# Patient Record
Sex: Female | Born: 1955 | Race: White | Hispanic: No | Marital: Married | Smoking: Never smoker
Health system: Southern US, Community
[De-identification: ages and names within clinical notes are randomized; demographics above are authoritative.]

## PROBLEM LIST (undated history)

## (undated) DIAGNOSIS — I471 Supraventricular tachycardia, unspecified: Secondary | ICD-10-CM

## (undated) DIAGNOSIS — T7840XA Allergy, unspecified, initial encounter: Secondary | ICD-10-CM

## (undated) HISTORY — DX: Supraventricular tachycardia: I47.1

## (undated) HISTORY — DX: Allergy, unspecified, initial encounter: T78.40XA

---

## 2013-02-24 ENCOUNTER — Ambulatory Visit (INDEPENDENT_AMBULATORY_CARE_PROVIDER_SITE_OTHER): Payer: BC Managed Care – PPO | Admitting: Family Medicine

## 2013-02-24 VITALS — BP 120/72 | HR 63 | Temp 97.8°F | Resp 16 | Ht 64.0 in | Wt 140.2 lb

## 2013-02-24 DIAGNOSIS — I471 Supraventricular tachycardia, unspecified: Secondary | ICD-10-CM | POA: Insufficient documentation

## 2013-02-24 MED ORDER — METOPROLOL SUCCINATE ER 25 MG PO TB24: 25.0000 mg | ORAL_TABLET | Freq: Every day | ORAL | Status: DC

## 2013-02-24 NOTE — Progress Notes (Signed)
Subjective:    Patient ID: Veronica Ray, female    DOB: [birth year], 57 y.o.   MRN: [id number]   Chief Complaint  Patient presents with  . Medication Refill    metoprolol   HPI  Moved here as husband is now employed with [employer] - moved here from [place], [place] where they had been for 5 years.  Here to establish care today. Has had 2 episodes of SVT -initial one episode prior had subsided on its own. During the second episode she did end up in the ER and so was officially diagnosed and has the rhythm strips from that.  During the episode she was under a fair amount of stress at work when she became pre-syncopal.  Was using phone app to check on HR and noted as very very high so went into the ER and was diagnosed w/ SVT.  Did have stress test after and a cardiac MRI and was told her heart was good. Just put on low dose toprol prophylactic ally.  Gets her physicals annually w/ her prior PCP so feels that labs and health maintenance are UTD.  Past Medical History  Diagnosis Date  . Hypertension   . Allergy    No current outpatient prescriptions on file prior to visit.   No current facility-administered medications on file prior to visit.   No Known Allergies   Review of Systems  Constitutional: Negative for fever, chills, diaphoresis and appetite change.  Eyes: Negative for visual disturbance.  Respiratory: Negative for cough and shortness of breath.   Cardiovascular: Negative for chest pain, palpitations and leg swelling.  Genitourinary: Negative for decreased urine volume.  Neurological: Negative for dizziness, tremors, syncope, weakness, light-headedness and headaches.  Hematological: Does not bruise/bleed easily.       BP 120/72  Pulse 63  Temp(Src) 97.8 F (36.6 C) (Oral)  Resp 16  Ht 5\' 4"  (1.626 m)  Wt 140 lb 3.2 oz (63.594 kg)  BMI 24.05 kg/m2  SpO2 99% Objective:   Physical Exam  Constitutional: She is oriented to person, place, and time. She appears well-developed and  well-nourished. No distress.  HENT:  Head: Normocephalic and atraumatic.  Right Ear: External ear normal.  Left Ear: External ear normal.  Eyes: Conjunctivae are normal. No scleral icterus.  Neck: Normal range of motion. Neck supple. No thyromegaly present.  Cardiovascular: Normal rate, regular rhythm, normal heart sounds and intact distal pulses.   Pulmonary/Chest: Effort normal and breath sounds normal. No respiratory distress.  Musculoskeletal: She exhibits no edema.  Lymphadenopathy:    She has no cervical adenopathy.  Neurological: She is alert and oriented to person, place, and time.  Skin: Skin is warm and dry. She is not diaphoretic. No erythema.  Psychiatric: She has a normal mood and affect. Her behavior is normal.      Assessment & Plan:   Paroxysmal SVT (supraventricular tachycardia) - refilled toprol. Pt wants to establish care here. Recommend she schedule an appt at 104 for a CPE at her convenience. Releases were signed and faxed to obtain her old medical records. Meds ordered this encounter  Medications  . DISCONTD: metoprolol succinate (TOPROL-XL) 25 MG 24 hr tablet    Sig: Take 25 mg by mouth daily.  . fish oil-omega-3 fatty acids 1000 MG capsule    Sig: Take 2 g by mouth daily.  . Multiple Vitamins-Minerals (MULTIVITAMIN WITH MINERALS) tablet    Sig: Take 1 tablet by mouth daily.  . metoprolol succinate (TOPROL-XL) 25 MG  24 hr tablet    Sig: Take 1 tablet (25 mg total) by mouth daily.    Dispense:  90 tablet    Refill:  3    I personally performed the services described in this documentation, which was scribed in my presence. The recorded information has been reviewed and considered, and addended by me as needed.  Norberto Sorenson, MD MPH

## 2013-03-07 ENCOUNTER — Telehealth: Payer: Self-pay

## 2013-03-07 NOTE — Telephone Encounter (Addendum)
PT WOULD LIKE TO SPEAK WITH SOMEONE REGARDING HER MEDICATION THAT SHOULD HAVE BEEN SENT TO [employer] SO IT COULD BE MAILED TO HER HOME PLEASE CALL [phone]

## 2013-03-07 NOTE — Telephone Encounter (Signed)
What does she need? Called her left message for her to call me back.

## 2013-03-07 NOTE — Telephone Encounter (Signed)
She does not know the mail order company, she does not have the Rx, she will call tomorrow and let you know which mail order to send the metoprolol

## 2013-03-08 MED ORDER — METOPROLOL SUCCINATE ER 25 MG PO TB24: 25.0000 mg | ORAL_TABLET | Freq: Every day | ORAL | Status: DC

## 2013-03-08 NOTE — Telephone Encounter (Signed)
Pt called back and provided phone and fax for mail order, which is [facility]. It looks as if no pharm was in EP[facility] when Dr Clelia Croft sent Rx at [facility] and therefore it did not go to [facility] at that time. I am sending now.

## 2014-03-13 ENCOUNTER — Encounter: Payer: Self-pay | Admitting: Family Medicine

## 2014-03-13 ENCOUNTER — Ambulatory Visit (INDEPENDENT_AMBULATORY_CARE_PROVIDER_SITE_OTHER): Payer: BC Managed Care – PPO | Admitting: Family Medicine

## 2014-03-13 VITALS — BP 120/80 | HR 64 | Temp 97.9°F | Resp 16 | Ht 63.5 in | Wt 142.0 lb

## 2014-03-13 DIAGNOSIS — I471 Supraventricular tachycardia: Secondary | ICD-10-CM

## 2014-03-13 DIAGNOSIS — B353 Tinea pedis: Secondary | ICD-10-CM

## 2014-03-13 DIAGNOSIS — R635 Abnormal weight gain: Secondary | ICD-10-CM

## 2014-03-13 DIAGNOSIS — Z0000 Encounter for general adult medical examination without abnormal findings: Secondary | ICD-10-CM

## 2014-03-13 DIAGNOSIS — Z1239 Encounter for other screening for malignant neoplasm of breast: Secondary | ICD-10-CM

## 2014-03-13 DIAGNOSIS — J302 Other seasonal allergic rhinitis: Secondary | ICD-10-CM

## 2014-03-13 DIAGNOSIS — Z23 Encounter for immunization: Secondary | ICD-10-CM

## 2014-03-13 DIAGNOSIS — B351 Tinea unguium: Secondary | ICD-10-CM

## 2014-03-13 LAB — COMPREHENSIVE METABOLIC PANEL
ALK PHOS: 97 U/L (ref 39–117)
ALT: 17 U/L (ref 0–35)
AST: 22 U/L (ref 0–37)
Albumin: 4.5 g/dL (ref 3.5–5.2)
BUN: 17 mg/dL (ref 6–23)
CO2: 28 mEq/L (ref 19–32)
Calcium: 9.6 mg/dL (ref 8.4–10.5)
Chloride: 103 mEq/L (ref 96–112)
Creat: 0.54 mg/dL (ref 0.50–1.10)
Glucose, Bld: 93 mg/dL (ref 70–99)
Potassium: 4.3 mEq/L (ref 3.5–5.3)
SODIUM: 140 meq/L (ref 135–145)
TOTAL PROTEIN: 6.7 g/dL (ref 6.0–8.3)
Total Bilirubin: 0.8 mg/dL (ref 0.2–1.2)

## 2014-03-13 LAB — POCT URINALYSIS DIPSTICK
BILIRUBIN UA: NEGATIVE
GLUCOSE UA: NEGATIVE
KETONES UA: NEGATIVE
Leukocytes, UA: NEGATIVE
Nitrite, UA: NEGATIVE
PROTEIN UA: NEGATIVE
Spec Grav, UA: <=1.005
Urobilinogen, UA: 0.2
pH, UA: 7

## 2014-03-13 LAB — CBC
HEMATOCRIT: 41 % (ref 36.0–46.0)
HEMOGLOBIN: 14.3 g/dL (ref 12.0–15.0)
MCH: 30.6 pg (ref 26.0–34.0)
MCHC: 34.9 g/dL (ref 30.0–36.0)
MCV: 87.6 fL (ref 78.0–100.0)
MPV: 10.7 fL (ref 9.4–12.4)
Platelets: 203 10*3/uL (ref 150–400)
RBC: 4.68 MIL/uL (ref 3.87–5.11)
RDW: 13.3 % (ref 11.5–15.5)
WBC: 5 10*3/uL (ref 4.0–10.5)

## 2014-03-13 LAB — TSH: TSH: 1 u[IU]/mL (ref 0.350–4.500)

## 2014-03-13 LAB — LIPID PANEL
CHOL/HDL RATIO: 3.2 ratio
Cholesterol: 185 mg/dL (ref 0–200)
HDL: 57 mg/dL (ref >39)
LDL Cholesterol: 112 mg/dL — ABNORMAL HIGH (ref 0–99)
Triglycerides: 82 mg/dL (ref <150)
VLDL: 16 mg/dL (ref 0–40)

## 2014-03-13 MED ORDER — METOPROLOL SUCCINATE ER 25 MG PO TB24: 25.0000 mg | ORAL_TABLET | Freq: Every day | ORAL | Status: DC

## 2014-03-13 MED ORDER — FLUTICASONE PROPIONATE 50 MCG/ACT NA SUSP: 2.0000 | Freq: Every day | NASAL | Status: DC

## 2014-03-13 NOTE — Patient Instructions (Addendum)
Look at the [facility] website for other treatment alternatives to toenail fungus/athlete's foot.  Allergic Rhinitis Allergic rhinitis is when the mucous membranes in the nose respond to allergens. Allergens are particles in the air that cause your body to have an allergic reaction. This causes you to release allergic antibodies. Through a chain of events, these eventually cause you to release histamine into the blood stream. Although meant to protect the body, it is this release of histamine that causes your discomfort, such as frequent sneezing, congestion, and an itchy, runny nose.  CAUSES  Seasonal allergic rhinitis (hay fever) is caused by pollen allergens that may come from grasses, trees, and weeds. Year-round allergic rhinitis (perennial allergic rhinitis) is caused by allergens such as house dust mites, pet dander, and mold spores.  SYMPTOMS   Nasal stuffiness (congestion).  Itchy, runny nose with sneezing and tearing of the eyes. DIAGNOSIS  Your health care provider can help you determine the allergen or allergens that trigger your symptoms. If you and your health care provider are unable to determine the allergen, skin or blood testing may be used. TREATMENT  Allergic rhinitis does not have a cure, but it can be controlled by:  Medicines and allergy shots (immunotherapy).  Avoiding the allergen. Hay fever may often be treated with antihistamines in pill or nasal spray forms. Antihistamines block the effects of histamine. There are over-the-counter medicines that may help with nasal congestion and swelling around the eyes. Check with your health care provider before taking or giving this medicine.  If avoiding the allergen or the medicine prescribed do not work, there are many new medicines your health care provider can prescribe. Stronger medicine may be used if initial measures are ineffective. Desensitizing injections can be used if medicine and avoidance does not work.  Desensitization is when a patient is given ongoing shots until the body becomes less sensitive to the allergen. Make sure you follow up with your health care provider if problems continue. HOME CARE INSTRUCTIONS It is not possible to completely avoid allergens, but you can reduce your symptoms by taking steps to limit your exposure to them. It helps to know exactly what you are allergic to so that you can avoid your specific triggers. SEEK MEDICAL CARE IF:   You have a fever.  You develop a cough that does not stop easily (persistent).  You have shortness of breath.  You start wheezing.  Symptoms interfere with normal daily activities. Document Released: 12/31/2000 Document Revised: 04/12/2013 Document Reviewed: 12/13/2012 [facility] Patient Information 2015 [place]. This information is not intended to replace advice given to you by your health care provider. Make sure you discuss any questions you have with your health care provider.      Why follow it? Research shows. . Those who follow the Mediterranean diet have a reduced risk of heart disease  . The diet is associated with a reduced incidence of Parkinson's and Alzheimer's diseases . People following the diet may have longer life expectancies and lower rates of chronic diseases  . The Dietary Guidelines for Americans recommends the Mediterranean diet as an eating plan to promote health and prevent disease  What Is the Mediterranean Diet?  . Healthy eating plan based on typical foods and recipes of Mediterranean-style cooking . The diet is primarily a plant based diet; these foods should make up a majority of meals   Starches - Plant based foods should make up a majority of meals - They are an important sources  of vitamins, minerals, energy, antioxidants, and fiber - Choose whole grains, foods high in fiber and minimally processed items  - Typical grain sources include wheat, oats, barley, corn, brown rice, bulgar, farro,  millet, polenta, couscous  - Various types of beans include chickpeas, lentils, fava beans, black beans, white beans   Fruits  Veggies - Large quantities of antioxidant rich fruits & veggies; 6 or more servings  - Vegetables can be eaten raw or lightly drizzled with oil and cooked  - Vegetables common to the traditional Mediterranean Diet include: artichokes, arugula, beets, broccoli, brussel sprouts, cabbage, carrots, celery, collard greens, cucumbers, eggplant, kale, leeks, lemons, lettuce, mushrooms, okra, onions, peas, peppers, potatoes, pumpkin, radishes, rutabaga, shallots, spinach, sweet potatoes, turnips, zucchini - Fruits common to the Mediterranean Diet include: apples, apricots, avocados, cherries, clementines, dates, figs, grapefruits, grapes, melons, nectarines, oranges, peaches, pears, pomegranates, strawberries, tangerines  Fats - Replace butter and margarine with healthy oils, such as olive oil, canola oil, and tahini  - Limit nuts to no more than a handful a day  - Nuts include walnuts, almonds, pecans, pistachios, pine nuts  - Limit or avoid candied, honey roasted or heavily salted nuts - Olives are central to the [employer]iterranean diet - can be eaten whole or used in a variety of dishes   Meats Protein - Limiting red meat: no more than a few times a month - When eating red meat: choose lean cuts and keep the portion to the size of deck of cards - Eggs: approx. 0 to 4 times a week  - Fish and lean poultry: at least 2 a week  - Healthy protein sources include, chicken, [place]key, lean beef, lamb - Increase intake of seafood such as tuna, salmon, trout, mackerel, shrimp, scallops - Avoid or limit high fat processed meats such as sausage and bacon  Dairy - Include moderate amounts of low fat dairy products  - Focus on healthy dairy such as fat free yogurt, skim milk, low or reduced fat cheese - Limit dairy products higher in fat such as whole or 2% milk, cheese, ice cream  Alcohol -  Moderate amounts of red wine is ok  - No more than 5 oz daily for women (all ages) and men older than age 59  - No more than 10 oz of wine daily for men younger than 2365  Other - Limit sweets and other desserts  - Use herbs and spices instead of salt to flavor foods  - Herbs and spices common to the traditional Mediterranean Diet include: basil, bay leaves, chives, cloves, cumin, fennel, garlic, lavender, marjoram, mint, oregano, parsley, pepper, rosemary, sage, savory, sumac, tarragon, thyme   It's not just a diet, it's a lifestyle:  . The Mediterranean diet includes lifestyle factors typical of those in the region  . Foods, drinks and meals are best eaten with others and savored . Daily physical activity is important for overall good health . This could be strenuous exercise like running and aerobics . This could also be more leisurely activities such as walking, housework, yard-work, or taking the stairs . Moderation is the key; a balanced and healthy diet accommodates most foods and drinks . Consider portion sizes and frequency of consumption of certain foods   Meal Ideas & Options:  . Breakfast:  o Whole wheat toast or whole wheat English muffins with peanut butter & hard boiled egg o Steel cut oats topped with apples & cinnamon and skim milk  o Fresh fruit: banana, strawberries,  melon, berries, peaches  o Smoothies: strawberries, bananas, greek yogurt, peanut butter o Low fat greek yogurt with blueberries and granola  o Egg white omelet with spinach and mushrooms o Breakfast couscous: whole wheat couscous, apricots, skim milk, cranberries  . Sandwiches:  o Hummus and grilled vegetables (peppers, zucchini, squash) on whole wheat bread   o Grilled chicken on whole wheat pita with lettuce, tomatoes, cucumbers or tzatziki  o Tuna salad on whole wheat bread: tuna salad made with greek yogurt, olives, red peppers, capers, green onions o Garlic rosemary lamb pita: lamb sauted with garlic,  rosemary, salt & pepper; add lettuce, cucumber, greek yogurt to pita - flavor with lemon juice and black pepper  . Seafood:  o Mediterranean grilled salmon, seasoned with garlic, basil, parsley, lemon juice and black pepper o Shrimp, lemon, and spinach whole-grain pasta salad made with low fat greek yogurt  o Seared scallops with lemon orzo  o Seared tuna steaks seasoned salt, pepper, coriander topped with tomato mixture of olives, tomatoes, olive oil, minced garlic, parsley, green onions and cappers  . Meats:  o Herbed greek chicken salad with kalamata olives, cucumber, feta  o Red bell peppers stuffed with spinach, bulgur, lean ground beef (or lentils) & topped with feta   o Kebabs: skewers of chicken, tomatoes, onions, zucchini, squash  o [place] burgers: made with red onions, mint, dill, lemon juice, feta cheese topped with roasted red peppers . Vegetarian o Cucumber salad: cucumbers, artichoke hearts, celery, red onion, feta cheese, tossed in olive oil & lemon juice  o Hummus and whole grain pita points with a greek salad (lettuce, tomato, feta, olives, cucumbers, red onion) o Lentil soup with celery, carrots made with vegetable broth, garlic, salt and pepper  o Tabouli salad: parsley, bulgur, mint, scallions, cucumbers, tomato, radishes, lemon juice, olive oil, salt and pepper.

## 2014-03-13 NOTE — Progress Notes (Signed)
Subjective:    Patient ID: Fayrene FearingJody Rossini, female    DOB: [birth year], 58 y.o.   MRN: [id number]  HPI This is a 58 yo female who presents today for a CPE.  Last mammo- 6/14 Last pap- 6/14- normal Colonoscopy- 2013 Tdap- 2 years ago Flu- today Dentist- regularly Eye- biannual  She was diagnosed with PSVT last year and has been on metoprolol xl 25 mg. She is tolerating without side effects. No further palpitations.   She regularly exercises (bicycle, treadmill) and tries to eat mostly vegetables, chicken and fish. She has noticed creeping weight gain in her 2250s.  Has had a sensation of fluid in her ears and near dizziness with rapid head movements for the last week. This was preceded by some nasal congestion. She has not taken any OTC medications for this.   Has some fungus on her right 4th and 5th toe nails. She is only interested in homeopathic remedies for this as she is concerned about side effects of Lamisil. She has also had some athlete's foot which has gotten better with epsom salts and tea tree oil.   Past Medical History  Diagnosis Date  . Hypertension   . Allergy    Past Surgical History  Procedure Laterality Date  . Cesarean section     Family History  Problem Relation Age of Onset  . Cancer Paternal Grandmother   . Cancer Paternal Grandfather    History  Substance Use Topics  . Smoking status: Never Smoker   . Smokeless tobacco: Not on file  . Alcohol Use: No   Review of Systems  Constitutional: Negative.   HENT: Positive for congestion. Ear pain: ear fullness.   Eyes: Negative.   Respiratory: Negative.   Cardiovascular: Negative.   Gastrointestinal: Negative.   Endocrine: Negative.   Genitourinary: Negative.   Musculoskeletal: Negative.   Skin: Rash: athlete's foot bilaterally, toe nail fungus right   Allergic/Immunologic: Positive for environmental allergies.  Neurological: Negative.   Hematological: Negative.   Psychiatric/Behavioral: Negative.         Objective:   Physical Exam  Constitutional: She is oriented to person, place, and time. She appears well-developed and well-nourished.  HENT:  Head: Normocephalic and atraumatic.  Right Ear: External ear and ear canal normal.  Left Ear: External ear and ear canal normal.  Nose: Nose normal.  Mouth/Throat: Oropharynx is clear and moist. No oropharyngeal exudate.  Bilateral TMs opaque.  Eyes: Conjunctivae and EOM are normal. Pupils are equal, round, and reactive to light. Right eye exhibits no discharge. Left eye exhibits no discharge. No scleral icterus.  Neck: Normal range of motion. Neck supple. No thyromegaly present.  Cardiovascular: Normal rate, regular rhythm, normal heart sounds and intact distal pulses.   Pulmonary/Chest: Effort normal and breath sounds normal. Right breast exhibits no inverted nipple, no mass, no nipple discharge, no skin change and no tenderness. Left breast exhibits no inverted nipple, no mass, no nipple discharge, no skin change and no tenderness. Breasts are symmetrical.  Abdominal: Soft. Bowel sounds are normal.  Genitourinary: Rectum normal and vagina normal. No breast swelling, tenderness, discharge or bleeding. Pelvic exam was performed with patient supine. No labial fusion. There is no rash, tenderness, lesion or injury on the right labia. There is no rash, tenderness, lesion or injury on the left labia. Cervix exhibits no discharge and no friability. No vaginal discharge found.  Musculoskeletal: Normal range of motion.  Lymphadenopathy:    She has no cervical adenopathy.  Neurological: She is  alert and oriented to person, place, and time. She has normal reflexes.  Skin: Skin is warm and dry.  Right 4th and 5th toenails with some yellowing and thickening. Nails trimmed close. Small amount peeling bottom of left foot.    Psychiatric: She has a normal mood and affect. Her behavior is normal. Judgment and thought content normal.  Vitals reviewed.  BP  120/80 mmHg  Pulse 64  Temp(Src) 97.9 F (36.6 C) (Oral)  Resp 16  Ht 5' 3.5" (1.613 m)  Wt 142 lb (64.411 kg)  BMI 24.76 kg/m2  SpO2 96%     Assessment & Plan:  1. Annual physical exam - POCT urinalysis dipstick  2. SVT (supraventricular tachycardia) - metoprolol succinate (TOPROL-XL) 25 MG 24 hr tablet; Take 1 tablet (25 mg total) by mouth daily.  Dispense: 90 tablet; Refill: 3 - CBC - Lipid panel - Comprehensive metabolic panel - TSH  3. Other seasonal allergic rhinitis - fluticasone (FLONASE) 50 MCG/ACT nasal spray; Place 2 sprays into both nostrils daily.  Dispense: 16 g; Refill: 5  4. Weight gain - Comprehensive metabolic panel - TSH  5. Screening for breast cancer - MM DIGITAL SCREENING BILATERAL; Future  6. Flu vaccine need - Flu Vaccine QUAD 36+ mos IM  7. Toenail fungus -patient prefers to explore non pharmacologic remedies.   8. Tinea pedis of left foot -She seems to be getting some improvement with Epsom salts and tea tree oil product. Encouraged synthetic socks with cycling.    Emi Belfasteborah B. Ayaka Andes, FNP-BC  [facility]  03/13/2014 10:07 PM

## 2014-04-07 ENCOUNTER — Ambulatory Visit
Admission: RE | Admit: 2014-04-07 | Discharge: 2014-04-07 | Disposition: A | Discharge status: Home / Self Care | Payer: BC Managed Care – PPO | Source: Ambulatory Visit | Attending: Family Medicine | Admitting: Family Medicine

## 2014-04-07 DIAGNOSIS — Z1239 Encounter for other screening for malignant neoplasm of breast: Secondary | ICD-10-CM

## 2014-05-01 ENCOUNTER — Telehealth: Payer: Self-pay

## 2014-05-01 ENCOUNTER — Ambulatory Visit (INDEPENDENT_AMBULATORY_CARE_PROVIDER_SITE_OTHER): Payer: BC Managed Care – PPO | Admitting: Physician Assistant

## 2014-05-01 VITALS — BP 122/80 | HR 69 | Temp 98.2°F | Resp 16 | Ht 64.0 in | Wt 143.6 lb

## 2014-05-01 DIAGNOSIS — H6983 Other specified disorders of Eustachian tube, bilateral: Secondary | ICD-10-CM

## 2014-05-01 DIAGNOSIS — H8110 Benign paroxysmal vertigo, unspecified ear: Secondary | ICD-10-CM

## 2014-05-01 MED ORDER — FEXOFENADINE HCL 30 MG PO TABS: 30.0000 mg | ORAL_TABLET | Freq: Two times a day (BID) | ORAL | Status: DC

## 2014-05-01 MED ORDER — FEXOFENADINE HCL 60 MG PO TABS: 60.0000 mg | ORAL_TABLET | Freq: Two times a day (BID) | ORAL | Status: AC

## 2014-05-01 MED ORDER — LORATADINE 10 MG PO TABS: 10.0000 mg | ORAL_TABLET | Freq: Every day | ORAL | Status: DC

## 2014-05-01 MED ORDER — MECLIZINE HCL 32 MG PO TABS: 32.0000 mg | ORAL_TABLET | Freq: Three times a day (TID) | ORAL | Status: DC | PRN

## 2014-05-01 NOTE — Progress Notes (Signed)
IDENTIFYING INFORMATION  Veronica Ray / DOB: 20-Jul-1955 / MRN: [id number]  The patient has Paroxysmal SVT (supraventricular tachycardia) on her problem list.  SUBJECTIVE  CC: Ear Fullness and Hand Pain   HPI: Veronica Ray is a 59 y.o. y.o. female presenting for ear fullness and mild dizziness with turning her head quickly. This started three to four weeks ago. She reports tugging at her ears and feels like if she could "pop her ears" she would feel better.  She also feels dizzy when she turns her head quickly.  She describes this as a spinning sensation, and reports it last roughly 10-15 seconds and then resolves.   She has been using Flonase for the last three weeks, and she also uses a [employer] Pot, and she does not know if this is helping.    She complains of right mild thumb pain that started a week ago after opening a jar of pickles.  Pincer grasping makes her pain worse.  She denies any trauma to the area.  She does not have a history of osteoporosis/penia.  She denies a history of arthritis.  She has not tried anything for this problem.     She  has a past medical history of Allergy.    She has a current medication list which includes the following prescription(s): b complex vitamins, fish oil-omega-3 fatty acids, fluticasone, metoprolol succinate, and multivitamin with minerals.  Veronica Ray has No Known Allergies. She  reports that she has never smoked. She does not have any smokeless tobacco history on file. She reports that she does not drink alcohol or use illicit drugs. She  has no sexual activity history on file.  The patient  has past surgical history that includes Cesarean section.  Her family history includes Cancer in her paternal grandfather and paternal grandmother.  Review of Systems  Constitutional: Negative.   HENT: Positive for congestion. Negative for ear discharge, ear pain, hearing loss, sore throat and tinnitus.   Eyes: Negative.   Respiratory: Negative for  cough, sputum production and shortness of breath.   Cardiovascular: Negative for chest pain.  Gastrointestinal: Negative.   Musculoskeletal: Negative for neck pain.  Neurological: Positive for dizziness (transient, with head turning only). Negative for tingling, tremors and headaches.    OBJECTIVE  Blood pressure 122/80, pulse 69, temperature 98.2 F (36.8 C), temperature source Oral, resp. rate 16, height 5\' 4"  (1.626 m), weight 143 lb 9.6 oz (65.137 kg), SpO2 98 %. The patient's body mass index is 24.64 kg/(m^2).  Physical Exam  Constitutional: She is oriented to person, place, and time. She appears well-developed and well-nourished. No distress.  HENT:  Right Ear: No drainage or tenderness. Tympanic membrane is not injected, not perforated, not erythematous and not bulging. No middle ear effusion. No decreased hearing is noted.  Left Ear: No drainage or tenderness. Tympanic membrane is not injected, not perforated, not erythematous and not bulging.  No middle ear effusion. No decreased hearing is noted.  Ears:  Nose: Nose normal. No mucosal edema.  Mouth/Throat: Uvula is midline, oropharynx is clear and moist and mucous membranes are normal.  Cardiovascular: Normal rate, regular rhythm, normal heart sounds and intact distal pulses.   Respiratory: Effort normal and breath sounds normal.  Neurological: She is alert and oriented to person, place, and time. She has normal strength. She displays no tremor. No cranial nerve deficit or sensory deficit. She exhibits normal muscle tone. Coordination and gait normal.  Skin: Skin is warm and dry.  She is not diaphoretic.  Psychiatric: She has a normal mood and affect. Her behavior is normal. Judgment and thought content normal.    No results found for this or any previous visit (from the past 24 hour(s)).  ASSESSMENT & PLAN  Alayza was seen today for ear fullness and hand pain.  Diagnoses and associated orders for this visit:  Eustachian  tube dysfunction, bilateral: Patient is a poor candidate for pseudoephedrine.   - loratadine (CLARITIN) 10 MG tablet; Take 1 tablet (10 mg total) by mouth daily. - fexofenadine (ALLEGRA) 60 MG tablet; Take 1 tablet (60 mg total) by mouth 2 (two) times daily for five days only.    Benign paroxysmal positional vertigo, unspecified laterality - meclizine (ANTIVERT) 32 MG tablet; Take 1 tablet (32 mg total) by mouth 3 (three) times daily as needed.   The patient was instructed to to call or comeback to clinic as needed, or should symptoms warrant.  Deliah Boston, MHS, PA-C [facility] 05/01/2014 4:18 PM

## 2014-05-01 NOTE — Telephone Encounter (Signed)
LM to advise pt to RTC

## 2014-05-01 NOTE — Telephone Encounter (Signed)
DR. Clelia CroftSHAW, PT STATES THAT SHE IS STILL HAVING ISSUE WITH HER EAR AND WOULD LIKE TO KNOW IF SHE COULD COME IN?  BEST# [phone]

## 2014-05-02 ENCOUNTER — Encounter: Payer: Self-pay | Admitting: Physician Assistant

## 2014-05-30 ENCOUNTER — Encounter: Payer: Self-pay | Admitting: Physician Assistant

## 2014-09-19 ENCOUNTER — Ambulatory Visit (INDEPENDENT_AMBULATORY_CARE_PROVIDER_SITE_OTHER): Payer: BC Managed Care – PPO | Admitting: Family Medicine

## 2014-09-19 VITALS — BP 112/82 | HR 70 | Temp 98.4°F | Resp 16 | Ht 64.0 in | Wt 144.6 lb

## 2014-09-19 DIAGNOSIS — E042 Nontoxic multinodular goiter: Secondary | ICD-10-CM | POA: Diagnosis not present

## 2014-09-19 DIAGNOSIS — K1379 Other lesions of oral mucosa: Secondary | ICD-10-CM

## 2014-09-19 DIAGNOSIS — B351 Tinea unguium: Secondary | ICD-10-CM

## 2014-09-19 DIAGNOSIS — S93401A Sprain of unspecified ligament of right ankle, initial encounter: Secondary | ICD-10-CM

## 2014-09-19 LAB — POCT CBC
Granulocyte percent: 63.6 %G (ref 37–80)
HCT, POC: 41.8 % (ref 37.7–47.9)
Hemoglobin: 14 g/dL (ref 12.2–16.2)
Lymph, poc: 1.5 (ref 0.6–3.4)
MCH, POC: 30 pg (ref 27–31.2)
MCHC: 33.4 g/dL (ref 31.8–35.4)
MCV: 89.9 fL (ref 80–97)
MID (CBC): 0.4 (ref 0–0.9)
MPV: 8.3 fL (ref 0–99.8)
POC Granulocyte: 3.4 (ref 2–6.9)
POC LYMPH PERCENT: 28.7 %L (ref 10–50)
POC MID %: 7.7 % (ref 0–12)
Platelet Count, POC: 188 10*3/uL (ref 142–424)
RBC: 4.65 M/uL (ref 4.04–5.48)
RDW, POC: 12.5 %
WBC: 5.3 10*3/uL (ref 4.6–10.2)

## 2014-09-19 LAB — C-REACTIVE PROTEIN: CRP: <0.5 mg/dL (ref <0.60)

## 2014-09-19 LAB — COMPREHENSIVE METABOLIC PANEL
ALBUMIN: 4.3 g/dL (ref 3.5–5.2)
ALT: 14 U/L (ref 0–35)
AST: 20 U/L (ref 0–37)
Alkaline Phosphatase: 87 U/L (ref 39–117)
BUN: 17 mg/dL (ref 6–23)
CO2: 27 mEq/L (ref 19–32)
CREATININE: 0.62 mg/dL (ref 0.50–1.10)
Calcium: 9.2 mg/dL (ref 8.4–10.5)
Chloride: 105 mEq/L (ref 96–112)
Glucose, Bld: 93 mg/dL (ref 70–99)
Potassium: 4.6 mEq/L (ref 3.5–5.3)
Sodium: 141 mEq/L (ref 135–145)
Total Bilirubin: 0.7 mg/dL (ref 0.2–1.2)
Total Protein: 6.4 g/dL (ref 6.0–8.3)

## 2014-09-19 LAB — POCT SEDIMENTATION RATE: POCT SED RATE: 24 mm/hr — AB (ref 0–22)

## 2014-09-19 LAB — THYROID PANEL WITH TSH
Free Thyroxine Index: 1.9 (ref 1.4–3.8)
T3 Uptake: 27 % (ref 22–35)
T4 TOTAL: 7.2 ug/dL (ref 4.5–12.0)
TSH: 0.722 u[IU]/mL (ref 0.350–4.500)

## 2014-09-19 LAB — VITAMIN B12: Vitamin B-12: 684 pg/mL (ref 211–911)

## 2014-09-19 LAB — FERRITIN: FERRITIN: 123 ng/mL (ref 10–291)

## 2014-09-19 NOTE — Progress Notes (Addendum)
Subjective:  The patient was seen in room 14. Patient's care was started at 11:03 AM.   Patient ID: Veronica Ray, female    DOB: 1955/09/16, 59 y.o.   MRN: [id number]  Chief Complaint  Patient presents with  . Ankle Problem    x 8 weeks  . bitter taste in mouth    x 2 week  . Lymphadenopathy    x 2 weeks   HPI HPI Comments: Veronica FearingJody Ray is a 59 y.o. female who presents to the [facility]     Reviewed October 2011.    Past Medical History  Diagnosis Date  . Allergy    Current Outpatient Prescriptions on File Prior to Visit  Medication Sig Dispense Refill  . b complex vitamins tablet Take 1 tablet by mouth daily.    . metoprolol succinate (TOPROL-XL) 25 MG 24 hr tablet Take 1 tablet (25 mg total) by mouth daily. 90 tablet 3  . Multiple Vitamins-Minerals (MULTIVITAMIN WITH MINERALS) tablet Take 1 tablet by mouth daily.    . fluticasone (FLONASE) 50 MCG/ACT nasal spray Place 2 sprays into both nostrils daily. (Patient not taking: Reported on 09/19/2014) 16 g 5  . loratadine (CLARITIN) 10 MG tablet Take 1 tablet (10 mg total) by mouth daily. (Patient not taking: Reported on 09/19/2014) 30 tablet 11  . meclizine (ANTIVERT) 32 MG tablet Take 1 tablet (32 mg total) by mouth 3 (three) times daily as needed. (Patient not taking: Reported on 09/19/2014) 30 tablet 0   No current facility-administered medications on file prior to visit.   No Known Allergies  Review of Systems  Constitutional: Positive for activity change. Negative for fever, chills, appetite change, fatigue and unexpected weight change.  Musculoskeletal: Positive for joint swelling, arthralgias and gait problem. Negative for myalgias and back pain.  Skin: Negative for color change and rash.  Neurological: Positive for weakness. Negative for numbness.  Hematological: Positive for adenopathy. Does not bruise/bleed easily.  Psychiatric/Behavioral: Negative for sleep disturbance.   BP 112/82 mmHg   Pulse 70  Temp(Src) 98.4 F (36.9 C) (Oral)  Resp 16  Ht 5\' 4"  (1.626 m)  Wt 144 lb 9.6 oz (65.59 kg)  BMI 24.81 kg/m2  SpO2 95%    Objective:   Physical Exam  Constitutional: She is oriented to person, place, and time. She appears well-developed and well-nourished. No distress.  HENT:  Head: Normocephalic and atraumatic.  Eyes: Conjunctivae and EOM are normal.  Neck: Neck supple. No tracheal deviation present.  Cardiovascular: Normal rate.   Pulmonary/Chest: Effort normal. No respiratory distress.  Musculoskeletal: Normal range of motion.  Neurological: She is alert and oriented to person, place, and time.  Skin: Skin is warm and dry.  Psychiatric: She has a normal mood and affect. Her behavior is normal.  Nursing note and vitals reviewed.  BP 112/82 mmHg  Pulse 70  Temp(Src) 98.4 F (36.9 C) (Oral)  Resp 16  Ht 5\' 4"  (1.626 m)  Wt 144 lb 9.6 oz (65.59 kg)  BMI 24.81 kg/m2  SpO2 95%     Assessment & Plan:   1. Right ankle sprain, initial encounter   2. Onychomycosis   3. Mouth pain   4. Nontoxic multinodular goiter     Orders Placed This Encounter  Procedures  . Vitamin B12  . Comprehensive metabolic panel  . Thyroid Panel With TSH  . C-reactive protein  . Ferritin  . POCT CBC  . POCT SEDIMENTATION RATE  Norberto Sorenson, MD MPH  Results for orders placed or performed in visit on 09/19/14  Vitamin B12  Result Value Ref Range   Vitamin B-12 684 211 - 911 pg/mL  Comprehensive metabolic panel  Result Value Ref Range   Sodium 141 135 - 145 mEq/L   Potassium 4.6 3.5 - 5.3 mEq/L   Chloride 105 96 - 112 mEq/L   CO2 27 19 - 32 mEq/L   Glucose, Bld 93 70 - 99 mg/dL   BUN 17 6 - 23 mg/dL   Creat 1.61 0.96 - 0.45 mg/dL   Total Bilirubin 0.7 0.2 - 1.2 mg/dL   Alkaline Phosphatase 87 39 - 117 U/L   AST 20 0 - 37 U/L   ALT 14 0 - 35 U/L   Total Protein 6.4 6.0 - 8.3 g/dL   Albumin 4.3 3.5 - 5.2 g/dL   Calcium 9.2 8.4 - 40.9 mg/dL  Thyroid Panel With TSH    Result Value Ref Range   T4, Total 7.2 4.5 - 12.0 ug/dL   T3 Uptake 27 22 - 35 %   Free Thyroxine Index 1.9 1.4 - 3.8   TSH 0.722 0.350 - 4.500 uIU/mL  C-reactive protein  Result Value Ref Range   CRP <0.5 <0.60 mg/dL  Ferritin  Result Value Ref Range   Ferritin 123 10 - 291 ng/mL  POCT CBC  Result Value Ref Range   WBC 5.3 4.6 - 10.2 K/uL   Lymph, poc 1.5 0.6 - 3.4   POC LYMPH PERCENT 28.7 10 - 50 %L   MID (cbc) 0.4 0 - 0.9   POC MID % 7.7 0 - 12 %M   POC Granulocyte 3.4 2 - 6.9   Granulocyte percent 63.6 37 - 80 %G   RBC 4.65 4.04 - 5.48 M/uL   Hemoglobin 14.0 12.2 - 16.2 g/dL   HCT, POC 81.1 91.4 - 47.9 %   MCV 89.9 80 - 97 fL   MCH, POC 30.0 27 - 31.2 pg   MCHC 33.4 31.8 - 35.4 g/dL   RDW, POC 78.2 %   Platelet Count, POC 188 142 - 424 K/uL   MPV 8.3 0 - 99.8 fL  POCT SEDIMENTATION RATE  Result Value Ref Range   POCT SED RATE 24 (A) 0 - 22 mm/hr

## 2014-09-19 NOTE — Patient Instructions (Signed)
Posterior Ankle Impingement and Posterior Process with Rehab Impingement is a condition in which bones pinch surrounding soft tissues and cause pain and inflammation of the soft tissues. Posterior ankle impingement is an impingement of the soft tissues in the back (posterior) of the ankle. The bones that cause posterior ankle impingement are most commonly the ankle bone (talus) and an extra ankle bone, known as the os trigonum. SYMPTOMS   Pain, tenderness, swelling, and bruising (contusion) in the posterior ankle.  Pain that worsens with pointing the toes downward (plantar flexion) as with ballet dancers and gymnasts.  Pain when running, jumping, or walking down stairs or hills or when squatting while standing on the toes. CAUSES  Posterior ankle impingement is caused by the soft tissue being pinched between bony structures, and results in adverse symptoms. Common mechanisms of injury include:  Repetitive stress full plantar flexion.  Direct trauma to the heel. RISK INCREASES WITH:   Activities that involve repetitive ankle plantar flexion (ballet, gymnastics, or ice skating).  Activities that involve trauma while the foot is plantar flexed (kicking).  Previous foot or ankle injury.  Improperly fitted or unsupportive shoes.  Poor strength and flexibility. PREVENTION  Warm up and stretch properly before activity.  Maintain physical fitness:  Strength, flexibility, and endurance.  Cardiovascular fitness.  Learn and use proper technique and have a coach correct improper technique.  Protect the ankle from excessive plantar flexion with tape, braces, or compression bandages.  Wear properly fitted and supportive shoes. PROGNOSIS  If treated properly, then the symptoms of posterior ankle impingement usually resolve within 4 to 6 weeks.  RELATED COMPLICATIONS   Prolonged healing time, if improperly treated or re-injured.  Recurrent symptoms that result in a chronic  problem.  Inability to compete in athletics.  Risks of surgery: infection, bleeding, nerve damage, or damage to surrounding tissues. TREATMENT  Treatment initially involves the use of ice and medication to help reduce pain and inflammation. The use of strengthening and stretching exercises may help reduce pain with activity. These exercises may be performed at home or with referral to a therapist. Your caregiver may also recommend that you immobilize the ankle for a period of time to allow for healing of damaged tissue. Corticosteroid injections may be given to reduce inflammation, but should be reserved for the most severe cases. If symptoms persist for greater than 6 months despite non-surgical (conservative) treatment, then surgery may be recommended. Surgery involves the removal of a portion of the bone that is placing pressure on the surrounding soft tissue.  MEDICATION  If pain medication is necessary, then nonsteroidal anti-inflammatory medications, such as aspirin and ibuprofen, or other minor pain relievers, such as acetaminophen, are often recommended.  Do not take pain medication for 7 days before surgery.  Prescription pain relievers may be given if deemed necessary by your caregiver. Use only as directed and only as much as you need.  Corticosteroid injections may be given by your caregiver. These injections should be reserved for the most serious cases, because they may only be given a certain number of times. HEAT AND COLD  Cold treatment (icing) relieves pain and reduces inflammation. Cold treatment should be applied for 10 to 15 minutes every 2 to 3 hours for inflammation and pain and immediately after any activity that aggravates your symptoms. Use ice packs or massage the area with a piece of ice (ice massage).  Heat treatment may be used prior to performing the stretching and strengthening activities prescribed by your  caregiver, physical therapist, or athletic trainer. Use a  heat pack or soak the injury in warm water. SEEK MEDICAL CARE IF:  Treatment seems to offer no benefit, or the condition worsens.  Any medications produce adverse side effects.  Any complications from surgery occur:  Pain, numbness, or coldness in the extremity operated upon.  Discoloration of the nail beds (they become blue or gray) of the extremity operated upon.  Signs of infections (fever, pain, inflammation, redness, or persistent bleeding). EXERCISES RANGE OF MOTION (ROM) AND STRETCHING EXERCISES - Posterior Ankle Impingement These exercises may help you when beginning to rehabilitate your injury. Your symptoms may resolve with or without further involvement from your physician, physical therapist, or athletic trainer. While completing these exercises, remember:   Restoring tissue flexibility helps restore normal motion to the joints. This allows healthier, less painful movement and activity.  An effective stretch should be held for at least 30 seconds.  A stretch should never be painful. You should only feel a gentle lengthening or release in the stretched tissue. RANGE OF MOTION - Toe Extension, Flexion  Sit with your right / left leg crossed over your opposite knee.  Grasp your toes and gently pull them back toward the top of your foot. You should feel a stretch on the bottom of your toes and/or foot.  Hold this stretch for __________ seconds.  Now, gently pull your toes toward the bottom of your foot. You should feel a stretch on the top of your toes and foot.  Hold this stretch for __________ seconds. Repeat __________ times. Complete this stretch __________ times per day.  RANGE OF MOTION - Ankle Dorsiflexion, Active Assisted   Remove shoes and sit on a chair that is preferably not on a carpeted surface.  Place your right / left foot under your knee. Extend your opposite leg for support.  Keeping your heel down, slide your right / left foot back toward the chair  until you feel a stretch at your ankle or calf. If you do not feel a stretch, slide your bottom forward to the edge of the chair, while still keeping your heel down.  Hold this stretch for __________ seconds. Repeat __________ times. Complete this stretch __________ times per day.  STRETCH - Gastroc, Standing   Place hands on wall.  Extend your right / left leg, keeping your front knee somewhat bent.  Slightly point your toes inward on your back foot.  Keeping your right / left heel on the floor and your knee straight, shift your weight toward the wall, not allowing your back to arch.  You should feel a gentle stretch in your right / left calf. Hold this position for __________ seconds. Repeat __________ times. Complete this stretch __________ times per day. STRETCH - Soleus, Standing   Place hands on wall.  Extend your right / left leg, keeping the other knee somewhat bent.  Slightly point your toes inward on your back foot.  Keep your right / left heel on the floor, bend your back knee, and slightly shift your weight over your back leg so that you feel a gentle stretch deep in your back calf.  Hold this position for __________ seconds. Repeat __________ times. Complete this stretch __________ times per day. STRETCH - Gastrocsoleus, Standing Note: This exercise can place a lot of stress on your foot and ankle. Please complete this exercise only if specifically instructed by your caregiver.   Place the ball of your right / left foot on  a step, keeping your other foot firmly on the same step.  Hold on to the wall or a rail for balance.  Slowly lift your other foot, allowing your body weight to press your heel down over the edge of the step.  You should feel a stretch in your right / left calf.  Hold this position for __________ seconds.  Repeat this exercise with a slight bend in your right / left knee. Repeat __________ times. Complete this stretch __________ times per day.   STRENGTHENING EXERCISES - Posterior Ankle Impingement  These exercises may help you when beginning to rehabilitate your injury. They may resolve your symptoms with or without further involvement from your physician, physical therapist, or athletic trainer. While completing these exercises, remember:   Muscles can gain both the endurance and the strength needed for everyday activities through controlled exercises.  Complete these exercises as instructed by your physician, physical therapist or athletic trainer. Progress the resistance and repetitions only as guided. STRENGTH - Plantar-flexors   Sit with your right / left leg extended. Holding onto both ends of a rubber exercise band/tubing, loop it around the ball of your foot. Keep a slight tension in the band.  Slowly push your toes away from you, pointing them downward.  Hold this position for __________ seconds. Return slowly, controlling the tension in the band/tubing. Repeat __________ times. Complete this exercise __________ times per day.  STRENGTH - Plantar-flexors  Stand with your feet shoulder width apart. Steady yourself with a wall or table using as little support as needed.  Keeping your weight evenly spread over the width of your feet, rise up on your toes.*  Hold this position for __________ seconds. Repeat __________ times. Complete this exercise __________ times per day.  *If this is too easy, shift your weight toward your right / left leg until you feel challenged. Ultimately, you may be asked to do this exercise with your right / left foot only. STRENGTH - Towel Curls  Sit in a chair positioned on a non-carpeted surface.  Place your foot on a towel, keeping your heel on the floor.  Pull the towel toward your heel by only curling your toes. Keep your heel on the floor.  If instructed by your physician, physical therapist or athletic trainer, add ____________________ at the end of the towel. Repeat __________ times.  Complete this exercise __________ times per day. STRENGTH - Ankle Eversion  Secure one end of a rubber exercise band/tubing to a fixed object (table, pole). Loop the other end around your foot just before your toes.  Place your fists between your knees. This will focus your strengthening at your ankle.  Drawing the band/tubing across your opposite foot, slowly, pull your little toe out and up. Make sure the band/tubing is positioned to resist the entire motion.  Hold this position for __________ seconds.  Have your muscles resist the band/tubing as it slowly pulls your foot back to the starting position. Repeat __________ times. Complete this exercise __________ times per day.  STRENGTH - Ankle Inversion   Secure one end of a rubber exercise band/tubing to a fixed object (table, pole). Loop the other end around your foot just before your toes.  Place your fists between your knees. This will focus your strengthening at your ankle.  Slowly pull your big toe up and in, making sure the band/tubing is positioned to resist the entire motion.  Hold this position for __________ seconds.  Have your muscles resist the band/tubing as  it slowly pulls your foot back to the starting position. Repeat __________ times. Complete this exercises __________ times per day.  Document Released: 04/07/2005 Document Revised: 08/22/2013 Document Reviewed: 07/20/2008 [facility] Patient Information 2015 [place], [place]. This information is not intended to replace advice given to you by your health care provider. Make sure you discuss any questions you have with your health care provider.

## 2014-10-26 ENCOUNTER — Encounter: Payer: Self-pay | Admitting: Family Medicine

## 2014-11-27 MED ORDER — NYSTATIN 100000 UNIT/ML MT SUSP: 5.0000 mL | Freq: Four times a day (QID) | OROMUCOSAL | Status: DC

## 2015-01-09 ENCOUNTER — Encounter: Payer: Self-pay | Admitting: Family Medicine

## 2015-01-21 ENCOUNTER — Encounter: Payer: Self-pay | Admitting: Family Medicine

## 2015-01-27 ENCOUNTER — Other Ambulatory Visit: Payer: Self-pay | Admitting: Family Medicine

## 2015-03-23 ENCOUNTER — Ambulatory Visit (INDEPENDENT_AMBULATORY_CARE_PROVIDER_SITE_OTHER): Payer: BC Managed Care – PPO | Admitting: Physician Assistant

## 2015-03-23 ENCOUNTER — Ambulatory Visit (INDEPENDENT_AMBULATORY_CARE_PROVIDER_SITE_OTHER): Payer: BC Managed Care – PPO

## 2015-03-23 VITALS — BP 130/70 | HR 82 | Temp 98.8°F | Resp 16 | Ht 64.0 in | Wt 143.0 lb

## 2015-03-23 DIAGNOSIS — Z8739 Personal history of other diseases of the musculoskeletal system and connective tissue: Secondary | ICD-10-CM | POA: Diagnosis not present

## 2015-03-23 DIAGNOSIS — M25571 Pain in right ankle and joints of right foot: Secondary | ICD-10-CM | POA: Diagnosis not present

## 2015-03-23 DIAGNOSIS — Z87828 Personal history of other (healed) physical injury and trauma: Secondary | ICD-10-CM

## 2015-03-23 DIAGNOSIS — Z760 Encounter for issue of repeat prescription: Secondary | ICD-10-CM | POA: Diagnosis not present

## 2015-03-23 DIAGNOSIS — J069 Acute upper respiratory infection, unspecified: Secondary | ICD-10-CM

## 2015-03-23 DIAGNOSIS — H109 Unspecified conjunctivitis: Secondary | ICD-10-CM

## 2015-03-23 DIAGNOSIS — B9789 Other viral agents as the cause of diseases classified elsewhere: Principal | ICD-10-CM

## 2015-03-23 MED ORDER — HYDROCODONE-HOMATROPINE 5-1.5 MG/5ML PO SYRP: 2.5000 mL | ORAL_SOLUTION | Freq: Every evening | ORAL | Status: DC | PRN

## 2015-03-23 MED ORDER — POLYMYXIN B-TRIMETHOPRIM 10000-0.1 UNIT/ML-% OP SOLN: 2.0000 [drp] | OPHTHALMIC | Status: DC

## 2015-03-23 MED ORDER — IBUPROFEN 600 MG PO TABS: 600.0000 mg | ORAL_TABLET | Freq: Three times a day (TID) | ORAL | Status: DC | PRN

## 2015-03-23 NOTE — Progress Notes (Addendum)
03/23/2015 10:40 PM   DOB: 1955-05-04 / MRN: [id number]  SUBJECTIVE:  Veronica Ray is a 59 y.o. female presenting for for the evaluation of cold that started 6 days ago.  Associated symptoms include congestion, cough, sore throat and laryngitis today and she denies fever. Also complains of left eye crusting and stinging, and she denies photophobia, eye pain, recent eye trauma and changes in vision. Treatments tried thus far include Contact with fair  relief. She reports sick contacts. She feels this problem is getting worse.   Complains that she suffered an ankle sprain roughly 6 months ago and reports her pain is much better, however continues to have pain on the posterior aspect of the lateral right malleolus.  States that it gets in the way of her walking program and other physical activities. She has not tried any medications as she tries to avoid medications.  She has been biking as an exercise alternative.    She has No Known Allergies.   She  has a past medical history of Allergy.    She  reports that she has never smoked. She does not have any smokeless tobacco history on file. She reports that she does not drink alcohol or use illicit drugs. She  has no sexual activity history on file. The patient  has past surgical history that includes Cesarean section.  Her family history includes Cancer in her paternal grandfather and paternal grandmother.  Review of Systems  Constitutional: Negative for fever and chills.  Eyes: Negative for blurred vision.  Respiratory: Negative for cough and shortness of breath.   Cardiovascular: Negative for chest pain.  Gastrointestinal: Negative for nausea and abdominal pain.  Genitourinary: Negative for dysuria, urgency and frequency.  Musculoskeletal: Negative for myalgias.  Skin: Negative for rash.  Neurological: Negative for dizziness, tingling and headaches.  Psychiatric/Behavioral: Negative for depression. The patient is not nervous/anxious.      Problem list and medications reviewed and updated by myself where necessary, and exist elsewhere in the encounter.   OBJECTIVE:  BP 130/70 mmHg  Pulse 82  Temp(Src) 98.8 F (37.1 C) (Oral)  Resp 16  Ht 5\' 4"  (1.626 m)  Wt 143 lb (64.864 kg)  BMI 24.53 kg/m2  SpO2 98%  Physical Exam  Constitutional: She is oriented to person, place, and time. She appears well-developed.  Eyes: EOM are normal. Pupils are equal, round, and reactive to light. Right conjunctiva is injected. Right conjunctiva has no hemorrhage. Left conjunctiva is not injected. Left conjunctiva has no hemorrhage.  Cardiovascular: Normal rate.   Pulmonary/Chest: Effort normal.  Abdominal: She exhibits no distension.  Musculoskeletal: Normal range of motion.       Right ankle: She exhibits swelling.       Left ankle: Normal.       Feet:  Neurological: She is alert and oriented to person, place, and time. No cranial nerve deficit.  Skin: Skin is warm and dry. She is not diaphoretic.  Psychiatric: She has a normal mood and affect.  Vitals reviewed.  [facility] reading (PRIMARY) by  PA Johnnie Goynes: STAT read please comment.    No results found for this or any previous visit (from the past 48 hour(s)).  ASSESSMENT AND PLAN:  Veronica Ray was seen today for nasal congestion, laryngitis, eyes watering, sore throat, medication refill and follow-up.  Diagnoses and all orders for this visit:  Viral URI with cough: Vitals and PE reassuring.  Advised symptomatic therapy for now.   -     HYDROcodone-homatropine (  HYCODAN) 5-1.5 MG/5ML syrup; Take 2.5-5 mLs by mouth at bedtime as needed. -     ibuprofen (ADVIL,MOTRIN) 600 MG tablet; Take 1 tablet (600 mg total) by mouth every 8 (eight) hours as needed.  Conjunctivitis of right eye: This is most likely 2/2 problem one.  Advised that she hold the antibiotic in favor of warm compress and start only if she begins to have copious drainage.  -     trimethoprim-polymyxin b (POLYTRIM) ophthalmic  solution; Place 2 drops into the right eye every 4 (four) hours.  Right ankle pain: Radiograph unremarkable.  Advised PT.  She will check with her insurance company.   -     ibuprofen (ADVIL,MOTRIN) 600 MG tablet; Take 1 tablet (600 mg total) by mouth every 8 (eight) hours as needed. -     DG Ankle Complete Right; Future  History of ankle sprain: See 3rd problem.   Medication refill: Provided six months of refills.  Her BP is controlled and her rate and rhythm is normal.      The patient was advised to call or return to clinic if she does not see an improvement in symptoms or to seek the care of the closest emergency department if she worsens with the above plan.   Deliah Boston, MHS, PA-C [facility] 03/23/2015 10:40 PM

## 2015-03-24 MED ORDER — METOPROLOL SUCCINATE ER 25 MG PO TB24: 25.0000 mg | ORAL_TABLET | Freq: Every day | ORAL | Status: DC

## 2015-03-24 NOTE — Addendum Note (Signed)
Addended by: Ofilia NeasLARK, Siah Steely L on: 03/24/2015 09:12 AM   Modules accepted: Orders, SmartSet

## 2015-03-31 ENCOUNTER — Encounter: Payer: Self-pay | Admitting: Physician Assistant

## 2015-04-01 ENCOUNTER — Other Ambulatory Visit: Payer: Self-pay | Admitting: Physician Assistant

## 2015-04-01 DIAGNOSIS — Z760 Encounter for issue of repeat prescription: Secondary | ICD-10-CM

## 2015-04-01 MED ORDER — METOPROLOL SUCCINATE ER 25 MG PO TB24: 25.0000 mg | ORAL_TABLET | Freq: Every day | ORAL | Status: DC

## 2015-07-20 ENCOUNTER — Encounter: Payer: Self-pay | Admitting: Physician Assistant

## 2015-07-31 ENCOUNTER — Telehealth: Payer: Self-pay

## 2015-07-31 DIAGNOSIS — Z760 Encounter for issue of repeat prescription: Secondary | ICD-10-CM

## 2015-07-31 MED ORDER — METOPROLOL SUCCINATE ER 25 MG PO TB24: 25.0000 mg | ORAL_TABLET | Freq: Every day | ORAL | Status: DC

## 2015-07-31 NOTE — Telephone Encounter (Signed)
Rx sent in

## 2015-07-31 NOTE — Telephone Encounter (Signed)
Pt states her insurance changed so she need her TOPROL-XL 25 MG 24 HR TABLET called in to a local pharmacy. Please call pt at [phone]     [facility]

## 2015-08-08 ENCOUNTER — Other Ambulatory Visit: Payer: Self-pay

## 2015-08-08 DIAGNOSIS — Z760 Encounter for issue of repeat prescription: Secondary | ICD-10-CM

## 2015-08-08 MED ORDER — METOPROLOL SUCCINATE ER 25 MG PO TB24: 25.0000 mg | ORAL_TABLET | Freq: Every day | ORAL | Status: DC

## 2015-08-09 ENCOUNTER — Telehealth: Payer: Self-pay

## 2015-08-09 DIAGNOSIS — Z760 Encounter for issue of repeat prescription: Secondary | ICD-10-CM

## 2015-08-09 MED ORDER — METOPROLOL SUCCINATE ER 25 MG PO TB24: 25.0000 mg | ORAL_TABLET | Freq: Every day | ORAL | Status: DC

## 2015-08-09 NOTE — Telephone Encounter (Signed)
Resent

## 2015-08-09 NOTE — Telephone Encounter (Signed)
Msg for Dr Clelia CroftShaw, mail order said they have not got a return fax to receive medication. Dr. Clelia CroftShaw will need to [facility] [phone] to get the meds metoprolol succinate (TOPROL-XL) 25 MG 24 hr tablet  Please advise  [phone]

## 2015-08-21 ENCOUNTER — Telehealth: Payer: Self-pay

## 2015-08-21 NOTE — Telephone Encounter (Signed)
Transfer all [facility]  [facility]   Phone [phone]  NEW insurance [employer] - group number [id number]  Sub  [id number]

## 2015-08-24 NOTE — Telephone Encounter (Signed)
Done

## 2015-10-16 ENCOUNTER — Encounter: Payer: Self-pay | Admitting: Family Medicine

## 2015-10-16 ENCOUNTER — Ambulatory Visit (INDEPENDENT_AMBULATORY_CARE_PROVIDER_SITE_OTHER): Payer: BC Managed Care – PPO | Admitting: Family Medicine

## 2015-10-16 VITALS — BP 110/76 | HR 74 | Temp 97.0°F | Resp 16 | Ht 64.0 in | Wt 140.0 lb

## 2015-10-16 DIAGNOSIS — R21 Rash and other nonspecific skin eruption: Secondary | ICD-10-CM

## 2015-10-16 DIAGNOSIS — B351 Tinea unguium: Secondary | ICD-10-CM

## 2015-10-16 DIAGNOSIS — R635 Abnormal weight gain: Secondary | ICD-10-CM

## 2015-10-16 DIAGNOSIS — Z0000 Encounter for general adult medical examination without abnormal findings: Secondary | ICD-10-CM | POA: Diagnosis not present

## 2015-10-16 DIAGNOSIS — Z124 Encounter for screening for malignant neoplasm of cervix: Secondary | ICD-10-CM | POA: Diagnosis not present

## 2015-10-16 DIAGNOSIS — Z13220 Encounter for screening for lipoid disorders: Secondary | ICD-10-CM | POA: Diagnosis not present

## 2015-10-16 LAB — CBC
HEMATOCRIT: 40 % (ref 35.0–45.0)
HEMOGLOBIN: 13.4 g/dL (ref 11.7–15.5)
MCH: 30.5 pg (ref 27.0–33.0)
MCHC: 33.5 g/dL (ref 32.0–36.0)
MCV: 91.1 fL (ref 80.0–100.0)
MPV: 10.7 fL (ref 7.5–12.5)
Platelets: 206 10*3/uL (ref 140–400)
RBC: 4.39 MIL/uL (ref 3.80–5.10)
RDW: 13.1 % (ref 11.0–15.0)
WBC: 5.5 10*3/uL (ref 3.8–10.8)

## 2015-10-16 LAB — COMPLETE METABOLIC PANEL WITH GFR
ALBUMIN: 4.4 g/dL (ref 3.6–5.1)
ALK PHOS: 85 U/L (ref 33–130)
ALT: 17 U/L (ref 6–29)
AST: 24 U/L (ref 10–35)
BUN: 14 mg/dL (ref 7–25)
CO2: 24 mmol/L (ref 20–31)
Calcium: 9.7 mg/dL (ref 8.6–10.4)
Chloride: 103 mmol/L (ref 98–110)
Creat: 0.69 mg/dL (ref 0.50–0.99)
GFR, Est African American: >89 mL/min (ref >=60)
GFR, Est Non African American: >89 mL/min (ref >=60)
GLUCOSE: 83 mg/dL (ref 65–99)
POTASSIUM: 4 mmol/L (ref 3.5–5.3)
SODIUM: 138 mmol/L (ref 135–146)
Total Bilirubin: 1.4 mg/dL — ABNORMAL HIGH (ref 0.2–1.2)
Total Protein: 6.4 g/dL (ref 6.1–8.1)

## 2015-10-16 LAB — LIPID PANEL
CHOLESTEROL: 164 mg/dL (ref 125–200)
HDL: 60 mg/dL (ref >=46)
LDL CALC: 89 mg/dL (ref <130)
TRIGLYCERIDES: 73 mg/dL (ref <150)
Total CHOL/HDL Ratio: 2.7 Ratio (ref <=5.0)
VLDL: 15 mg/dL (ref <30)

## 2015-10-16 LAB — TSH: TSH: 0.3 mIU/L — ABNORMAL LOW

## 2015-10-16 MED ORDER — TRIAMCINOLONE ACETONIDE 0.1 % EX CREA: 1.0000 "application " | TOPICAL_CREAM | Freq: Two times a day (BID) | CUTANEOUS | Status: DC

## 2015-10-16 NOTE — Patient Instructions (Addendum)
We recommend that you schedule a mammogram for breast cancer screening. Typically, you do not need a referral to do this. Please contact a local imaging center to schedule your mammogram.  [facility] - [phone]  *ask for the Radiology Department [facility] - [phone]) [phone] or [phone]) [phone]  [facility] - [phone] [facility] - [phone]) [phone] [facility] - [phone]) [phone]  *ask for the Radiology Department [facility] - [phone]  *ask for the Radiology Department [facility] - [phone]  *ask for the Mammography Department [facility] - [phone]) [phone]  Keeping You Healthy  Get These Tests  Blood Pressure- Have your blood pressure checked by your healthcare provider at least once a year.  Normal blood pressure is 120/80.  Weight- Have your body mass index (BMI) calculated to screen for obesity.  BMI is a measure of body fat based on height and weight.  You can calculate your own BMI at https://www.west-esparza.com/www.nhlbisupport.com/bmi/  Cholesterol- Have your cholesterol checked every year.  Diabetes- Have your blood sugar checked every year if you have high blood pressure, high cholesterol, a family history of diabetes or if you are overweight.  Pap Test - Have a pap test every 1 to 5 years if you have been sexually active.  If you are older than 65 and recent pap tests have been normal you may not need additional pap tests.  In addition, if you have had a hysterectomy  for benign disease additional pap tests are not necessary.  Mammogram-Yearly mammograms are essential for early detection of breast cancer  Screening for Colon Cancer- Colonoscopy starting at age 61. Screening may begin sooner depending on your family history and other health conditions.  Follow up colonoscopy as directed by your Gastroenterologist.  Screening for Osteoporosis- Screening begins at age 61 with bone density  scanning, sooner if you are at higher risk for developing Osteoporosis.  Get these medicines  Calcium with Vitamin D- Your body requires 1200-1500 mg of Calcium a day and [phone] IU of Vitamin D a day.  You can only absorb 500 mg of Calcium at a time therefore Calcium must be taken in 2 or 3 separate doses throughout the day.  Hormones- Hormone therapy has been associated with increased risk for certain cancers and heart disease.  Talk to your healthcare provider about if you need relief from menopausal symptoms.  Aspirin- Ask your healthcare provider about taking Aspirin to prevent Heart Disease and Stroke.  Get these Immuniztions  Flu shot- Every fall  Pneumonia shot- Once after the age of 61; if you are younger ask your healthcare provider if you need a pneumonia shot.  Tetanus- Every ten years.  Zostavax- Once after the age of 61 to prevent shingles.  Take these steps  Don't smoke- Your healthcare provider can help you quit. For tips on how to quit, ask your healthcare provider or go to www.smokefree.gov or call 1-800 QUIT-NOW.  Be physically active- Exercise 5 days a week for a minimum of 30 minutes.  If you are not already physically active, start slow and gradually work up to 30 minutes of moderate physical activity.  Try walking, dancing, bike riding, swimming, etc.  Eat a healthy diet- Eat a variety of healthy foods such as fruits, vegetables, whole grains, low fat milk, low fat cheeses, yogurt, lean meats, chicken, fish, eggs, dried beans, tofu, etc.  For more information go to www.thenutritionsource.org  Dental visit- Brush  and floss teeth twice daily; visit your dentist twice a year.  Eye exam- Visit your Optometrist or Ophthalmologist yearly.  Drink alcohol in moderation- Limit alcohol intake to one drink or less a day.  Never drink and drive.  Depression- Your emotional health is as important as your physical health.  If you're feeling down or losing interest in  things you normally enjoy, please talk to your healthcare provider.  Seat Belts- can save your life; always wear one  Smoke/Carbon Monoxide detectors- These detectors need to be installed on the appropriate level of your home.  Replace batteries at least once a year.  Violence- If anyone is threatening or hurting you, please tell your healthcare provider.  Living Will/ Health care power of attorney- Discuss with your healthcare provider and family.   IF you received an x-ray today, you will receive an invoice from [facility]. Please contact [facility] at [phone] with questions or concerns regarding your invoice.   IF you received labwork today, you will receive an invoice from [facility]. Please contact [facility] at [phone] with questions or concerns regarding your invoice.   Our billing staff will not be able to assist you with questions regarding bills from these companies.  You will be contacted with the lab results as soon as they are available. The fastest way to get your results is to activate your My Chart account. Instructions are located on the last page of this paperwork. If you have not heard from us regarding the results in 2 weeks, please contact this office.

## 2015-10-16 NOTE — Progress Notes (Signed)
Subjective:    Patient ID: Veronica Ray, female    DOB: October 10, 1955, 60 y.o.   MRN: [id number]  HPI This is a pleasant 60 yo female who presents today for CPE. She is married. 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   Last CPE- 04/12/14 Mammo- 04/07/14 Pap- several years Colonoscopy- 2013 Tdap- 2013 Flu- never Eye- annual Dental- regular Exercise- walks most days  Past Medical History  Diagnosis Date  . Allergy    Past Surgical History  Procedure Laterality Date  . Cesarean section     Family History  Problem Relation Age of Onset  . Cancer Paternal Grandmother   . Cancer Paternal Grandfather    Social History  Substance Use Topics  . Smoking status: Never Smoker   . Smokeless tobacco: None  . Alcohol Use: No      Review of Systems  Constitutional: Positive for diaphoresis (3x per week, these are improving with age, less frequent.) and unexpected weight change (thinks she has been gaining weight).  HENT: Positive for mouth sores (with avocadoes, no swelling of lips/mouth).   Eyes: Negative.   Respiratory: Negative.   Cardiovascular: Negative.   Gastrointestinal: Negative.   Endocrine: Negative.   Genitourinary: Negative.   Musculoskeletal: Negative.        More joint creaking.   Skin: Positive for rash (bilateral arms x 2-3 week, has gotten a little better, but no taway. ).       Toenail fungus, has been using some OTC remedies with some improvement.   Neurological: Positive for light-headedness (occasionally, has had eustachian tuve disorder) and headaches (wonders if this is from different foods especially popcorn, no toncistently.).  Hematological: Negative.   Psychiatric/Behavioral: Negative.        Objective:   Physical Exam Physical Exam  Constitutional: She is oriented to person, place, and time. She appears well-developed and well-nourished. No distress.  HENT:  Head: Normocephalic and atraumatic.  Right Ear: External ear normal.  Left  Ear: External ear normal.  Nose: Nose normal.  Mouth/Throat: Oropharynx is clear and moist. No oropharyngeal exudate.  Eyes: Conjunctivae are normal. Pupils are equal, round, and reactive to light.  Neck: Normal range of motion. Neck supple. No JVD present. No thyromegaly present.  Cardiovascular: Normal rate, regular rhythm, normal heart sounds and intact distal pulses.   Pulmonary/Chest: Effort normal and breath sounds normal. Right breast exhibits no inverted nipple, no mass, no nipple discharge, no skin change and no tenderness. Left breast exhibits no inverted nipple, no mass, no nipple discharge, no skin change and no tenderness. Breasts are symmetrical.  Abdominal: Soft. Bowel sounds are normal. She exhibits no distension and no mass. There is no tenderness. There is no rebound and no guarding.  Genitourinary: Vagina normal. Pelvic exam was performed with patient supine. There is no rash, tenderness, lesion or injury on the right labia. There is no rash, tenderness, lesion or injury on the left labia. Cervix exhibits no motion tenderness and no discharge. No vaginal discharge found.  Musculoskeletal: Normal range of motion. She exhibits no edema or tenderness.  Lymphadenopathy:    She has no cervical adenopathy.  Neurological: She is alert and oriented to person, place, and time. She has normal reflexes.  Skin: Skin is warm and dry. She is not diaphoretic. She has several small areas of erythema and scaling on her elbows. Toenails with thickened, discolored nails.  Psychiatric: She has a normal mood and affect. Her behavior is normal. Judgment and  thought content normal.  Vitals reviewed.  BP 110/76 mmHg  Pulse 74  Temp(Src) 97 F (36.1 C) (Oral)  Resp 16  Ht 5\' 4"  (1.626 m)  Wt 140 lb (63.504 kg)  BMI 24.02 kg/m2  SpO2 97% Wt Readings from Last 3 Encounters:  10/16/15 140 lb (63.504 kg)  03/23/15 143 lb (64.864 kg)  09/19/14 144 lb 9.6 oz (65.59 kg)       Assessment & Plan:    1. Annual physical exam - Discussed and encouraged healthy lifestyle choices- adequate sleep, regular exercise, stress management and healthy food choices.   2. Rash - triamcinolone cream (KENALOG) 0.1 %; Apply 1 application topically 2 (two) times daily. For maximum 10 days  Dispense: 45 g; Refill: 0  3. Toenail fungus - she is only interested in "natural" solutions. Discussed options and need to use regularly to see improvement.   4. Weight gain - CBC - TSH - COMPLETE METABOLIC PANEL WITH GFR - VITAMIN D 25 Hydroxy (Vit-D Deficiency, Fractures)  5. Screening for lipid disorders - Lipid panel  6. Screening for cervical cancer - Pap IG, CT/NG w/ reflex HPV when ASC-U   Olean Reeeborah Kou Gucciardo, FNP-BC  [facility]  10/17/2015 10:09 PM

## 2015-10-17 LAB — PAP IG, CT-NG, RFX HPV ASCU
Chlamydia Probe Amp: NOT DETECTED
GC PROBE AMP: NOT DETECTED

## 2015-10-17 LAB — VITAMIN D 25 HYDROXY (VIT D DEFICIENCY, FRACTURES): Vit D, 25-Hydroxy: 37 ng/mL (ref 30–100)

## 2015-11-23 ENCOUNTER — Ambulatory Visit (INDEPENDENT_AMBULATORY_CARE_PROVIDER_SITE_OTHER): Payer: BC Managed Care – PPO | Admitting: Physician Assistant

## 2015-11-23 VITALS — BP 122/78 | HR 68 | Temp 98.2°F | Resp 17 | Ht 63.5 in | Wt 141.0 lb

## 2015-11-23 DIAGNOSIS — Z23 Encounter for immunization: Secondary | ICD-10-CM

## 2015-11-23 DIAGNOSIS — M545 Low back pain, unspecified: Secondary | ICD-10-CM

## 2015-11-23 NOTE — Patient Instructions (Addendum)
Take 3 ibuprofen 600 every eight hours for pain.  Schedule this for the next five days, then start taking 600 every 12 hours for five days, then 600 once daily in the morning for five days.     IF you received an x-ray today, you will receive an invoice from [facility]. Please contact [facility] at [phone] with questions or concerns regarding your invoice.   IF you received labwork today, you will receive an invoice from [facility]. Please contact [facility] at [phone] with questions or concerns regarding your invoice.   Our billing staff will not be able to assist you with questions regarding bills from these companies.  You will be contacted with the lab results as soon as they are available. The fastest way to get your results is to activate your My Chart account. Instructions are located on the last page of this paperwork. If you have not heard from [place] regarding the results in 2 weeks, please contact this office.

## 2015-11-23 NOTE — Progress Notes (Signed)
   11/23/2015 6:31 PM   DOB: 06-25-1955 / MRN: [id number]  SUBJECTIVE:  Veronica Ray is a 60 y.o. female presenting for low back pain that started 2.5 weeks ago.  States the pain is mostly left sided and she reports an occasional shooting pain down the left leg.  She has been taking 200 mg of iburpofen daily as needed with good relief of the pain.   She has No Known Allergies.   She  has a past medical history of Allergy.    She  reports that she has never smoked. She does not have any smokeless tobacco history on file. She reports that she does not drink alcohol or use drugs. She  has no sexual activity history on file. The patient  has a past surgical history that includes Cesarean section.  Her family history includes Cancer in her paternal grandfather and paternal grandmother.  Review of Systems  Constitutional: Negative for diaphoresis, fever and malaise/fatigue.  Cardiovascular: Negative for chest pain.  Gastrointestinal: Negative for constipation.  Skin: Negative for itching and rash.  Neurological: Negative for dizziness and headaches.    The problem list and medications were reviewed and updated by myself where necessary and exist elsewhere in the encounter.   OBJECTIVE:  BP 122/78 (BP Location: Right Arm, Patient Position: Sitting, Cuff Size: Normal)   Pulse 68   Temp 98.2 F (36.8 C) (Oral)   Resp 17   Ht 5' 3.5" (1.613 m)   Wt 141 lb (64 kg)   SpO2 100%   BMI 24.59 kg/m   Physical Exam  Constitutional: She is oriented to person, place, and time. She appears well-developed and well-nourished. No distress.  Cardiovascular: Normal rate and regular rhythm.   Pulmonary/Chest: Effort normal and breath sounds normal.  Neurological: She is alert and oriented to person, place, and time. She has normal strength. She displays no atrophy, no tremor and normal reflexes. No cranial nerve deficit or sensory deficit. She exhibits normal muscle tone. She displays a negative Romberg  sign. She displays no seizure activity. Coordination and gait normal. GCS eye subscore is 4. GCS verbal subscore is 5. GCS motor subscore is 6.  Skin: Skin is warm and dry. She is not diaphoretic.    Lab Results  Component Value Date   CREATININE 0.69 10/16/2015     No results found for this or any previous visit (from the past 72 hour(s)).  No results found.  ASSESSMENT AND PLAN  Veronica Ray was seen today for back pain.  Diagnoses and all orders for this visit:  Acute right-sided low back pain without sciatica: No red flags.  Advised ibuprofen per AVS.  Call clinic in 15 days if not largely resolved and will consider prednisone.   Need for Tdap vaccination -     Tdap vaccine greater than or equal to 7yo IM    The patient is advised to call or return to clinic if she does not see an improvement in symptoms, or to seek the care of the closest emergency department if she worsens with the above plan.   Veronica Ray, MHS, PA-C [facility] 11/23/2015 6:31 PM

## 2016-01-15 ENCOUNTER — Other Ambulatory Visit: Payer: Self-pay | Admitting: Family Medicine

## 2016-01-15 DIAGNOSIS — Z760 Encounter for issue of repeat prescription: Secondary | ICD-10-CM

## 2016-01-17 NOTE — Telephone Encounter (Signed)
Pt due for follow up 6 months (December 2017)

## 2016-03-05 ENCOUNTER — Telehealth: Payer: Self-pay

## 2016-03-05 DIAGNOSIS — Z760 Encounter for issue of repeat prescription: Secondary | ICD-10-CM

## 2016-03-05 MED ORDER — METOPROLOL SUCCINATE ER 25 MG PO TB24: 25.0000 mg | ORAL_TABLET | Freq: Every day | ORAL | 0 refills | Status: DC

## 2016-03-05 NOTE — Telephone Encounter (Signed)
Pt LM stating that [facility] has sent reqs for metoprolol and she needs this RFd asap as she is almost out. We have not gotten any req's either by fax or electronically. I suspect that they have gone to D Gessner's new office as with several other pts. Sent in RF and called to advise pt as to what happened and that RF was sent. She thinks she will have enough med until it comes but will call me if she needs any sent locally.

## 2016-03-07 MED ORDER — METOPROLOL SUCCINATE ER 25 MG PO TB24: 25.0000 mg | ORAL_TABLET | Freq: Every day | ORAL | 0 refills | Status: DC

## 2016-03-07 NOTE — Telephone Encounter (Signed)
Pt LM today asking for 30 day sent to local pharm bc mail order will not get it here in time. Done and notified pt.

## 2016-03-07 NOTE — Addendum Note (Signed)
Addended by: Sheppard PlumberBRIGGS, Ukiah Trawick A on: 03/07/2016 12:26 PM   Modules accepted: Orders

## 2016-03-10 NOTE — Telephone Encounter (Signed)
Pt LM x 3 on Fri afternoon when I was out of the office stating that the local pharm can not get her Rx to go through either. She advised that the last pill bottle for 90 days was filled on 11/04/15. I called [facility] and they advised that the last fill they show is on 01/17/16 for a 90 day supply from [facility]. Then called [facility] and was advised after much checking that the shipment on 9/28 was not able to be delivered because it went to the wrong address. Called pt and told her this and gave her instr[facility] from [facility]: she is to call them and correct address then rep can speak to resolution specialist to enable her to get her RF sooner. Discussed options w/pt if they will not allow local pharm to fill a few days worth (pay OOP if not too exp, or if it is we may be able to Rx a few days of imm release). Pt agreed to all.

## 2016-03-31 ENCOUNTER — Other Ambulatory Visit: Payer: Self-pay | Admitting: Family Medicine

## 2016-03-31 DIAGNOSIS — Z1231 Encounter for screening mammogram for malignant neoplasm of breast: Secondary | ICD-10-CM

## 2016-04-23 ENCOUNTER — Ambulatory Visit
Admission: RE | Admit: 2016-04-23 | Discharge: 2016-04-23 | Disposition: A | Discharge status: Home / Self Care | Payer: BC Managed Care – PPO | Source: Ambulatory Visit | Attending: Family Medicine | Admitting: Family Medicine

## 2016-04-23 ENCOUNTER — Other Ambulatory Visit: Payer: Self-pay | Admitting: Family Medicine

## 2016-04-23 DIAGNOSIS — Z1231 Encounter for screening mammogram for malignant neoplasm of breast: Secondary | ICD-10-CM

## 2016-05-01 ENCOUNTER — Ambulatory Visit (INDEPENDENT_AMBULATORY_CARE_PROVIDER_SITE_OTHER): Payer: BC Managed Care – PPO

## 2016-05-01 DIAGNOSIS — Z23 Encounter for immunization: Secondary | ICD-10-CM | POA: Diagnosis not present

## 2016-05-03 ENCOUNTER — Other Ambulatory Visit: Payer: Self-pay | Admitting: Family Medicine

## 2016-05-03 DIAGNOSIS — Z760 Encounter for issue of repeat prescription: Secondary | ICD-10-CM

## 2016-05-04 MED ORDER — METOPROLOL SUCCINATE ER 25 MG PO TB24: 25.0000 mg | ORAL_TABLET | Freq: Every day | ORAL | 0 refills | Status: DC

## 2016-05-04 NOTE — Telephone Encounter (Signed)
09/2015 last ov 

## 2016-08-24 ENCOUNTER — Other Ambulatory Visit: Payer: Self-pay | Admitting: Family Medicine

## 2016-08-24 DIAGNOSIS — Z760 Encounter for issue of repeat prescription: Secondary | ICD-10-CM

## 2016-08-25 ENCOUNTER — Encounter: Payer: Self-pay | Admitting: Physician Assistant

## 2016-08-25 ENCOUNTER — Ambulatory Visit (INDEPENDENT_AMBULATORY_CARE_PROVIDER_SITE_OTHER): Payer: BC Managed Care – PPO | Admitting: Physician Assistant

## 2016-08-25 VITALS — BP 121/82 | HR 69 | Temp 97.9°F | Resp 18 | Ht 63.19 in | Wt 148.0 lb

## 2016-08-25 DIAGNOSIS — M5440 Lumbago with sciatica, unspecified side: Secondary | ICD-10-CM

## 2016-08-25 DIAGNOSIS — I471 Supraventricular tachycardia: Secondary | ICD-10-CM

## 2016-08-25 MED ORDER — METOPROLOL SUCCINATE ER 25 MG PO TB24: 25.0000 mg | ORAL_TABLET | Freq: Every day | ORAL | 1 refills | Status: DC

## 2016-08-25 NOTE — Progress Notes (Signed)
08/25/2016 11:41 AM   DOB: 12/26/1955 / MRN: [id number]  SUBJECTIVE:  Veronica Ray is a 61 y.o. female presenting for medication refills and back pain. 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 She denies palpitations and dizziness today. She [place] 2013.  This was diagnosed in the ED and she thinks that a cardiologist did actually see her during that time however she is not sure. She would like to try going off of the medication.  Tells me that she was under an abnormal amount of work related stress at that time. She feels that her stress is much less at this time. She has not missed any doses today.    She did hurt her back lifting some "pipe" at work about 3 weeks ago.  She has been taking NSAIDs for this and tells me that it is improving somewhat. She denies weakness or numbness in the lower extremity.   She has No Known Allergies.   She  has a past medical history of Allergy.    She  reports that she has never smoked. She does not have any smokeless tobacco history on file. She reports that she does not drink alcohol or use drugs. She  has no sexual activity history on file. The patient  has a past surgical history that includes Cesarean section.  Her family history includes Cancer in her paternal grandfather and paternal grandmother.  Review of Systems  Constitutional: Negative for chills, diaphoresis and fever.  Respiratory: Negative for cough, hemoptysis, sputum production, shortness of breath and wheezing.   Cardiovascular: Negative for chest pain, orthopnea and leg swelling.  Gastrointestinal: Negative for nausea.  Musculoskeletal: Positive for back pain. Negative for falls, joint pain, myalgias and neck pain.  Skin: Negative for rash.  Neurological: Negative for dizziness.    The problem list and medications were reviewed and updated by myself where necessary and exist elsewhere in the encounter.   OBJECTIVE:  BP 121/82    Pulse 69   Temp 97.9 F (36.6 C) (Oral)   Resp 18   Ht 5' 3.19" (1.605 m)   Wt 148 lb (67.1 kg)   SpO2 97%   BMI 26.06 kg/m   Wt Readings from Last 3 Encounters:  08/25/16 148 lb (67.1 kg)  11/23/15 141 lb (64 kg)  10/16/15 140 lb (63.5 kg)     Physical Exam  Constitutional: She is oriented to person, place, and time. She is active.  Non-toxic appearance.  Cardiovascular: Normal rate, regular rhythm, S1 normal, S2 normal, normal heart sounds and intact distal pulses.  Exam reveals no gallop, no friction rub and no decreased pulses.   No murmur heard. Pulmonary/Chest: Effort normal. No stridor. No tachypnea. No respiratory distress. She has no wheezes. She has no rales.  Abdominal: She exhibits no distension.  Musculoskeletal: She exhibits no edema.  Neurological: She is alert and oriented to person, place, and time.  Skin: Skin is warm and dry. She is not diaphoretic. No pallor.   Lab Results  Component Value Date   CREATININE 0.69 10/16/2015     No results found for this or any previous visit (from the past 72 hour(s)).  No results found.  ASSESSMENT AND PLAN:  Veronica Ray was seen today for medication refill and back pain.  Diagnoses and all orders for this visit:  Paroxysmal supraventricular tachycardia [facility](HCC): Asymptomatic today.  Will continue therapy.  -     metoprolol  succinate (TOPROL-XL) 25 MG 24 hr tablet; Take 1 tablet (25 mg total) by mouth daily.  Acute left-sided low back pain with sciatica, sciatica laterality unspecified    The patient is advised to call or return to clinic if she does not see an improvement in symptoms, or to seek the care of the closest emergency department if she worsens with the above plan.   Deliah Boston, MHS, PA-C [facility] 08/25/2016 11:41 AM

## 2017-02-16 ENCOUNTER — Telehealth: Payer: Self-pay

## 2017-02-16 NOTE — Telephone Encounter (Signed)
error 

## 2017-06-22 IMAGING — CR DG ANKLE COMPLETE 3+V*R*
2 series · 2 of 2 positions shown · non-contrast
Comparison: None.

CLINICAL DATA: 59-year-old female with right ankle sprain and pain.
The

EXAM:
RIGHT ANKLE - COMPLETE 3+ VIEW

[AP]
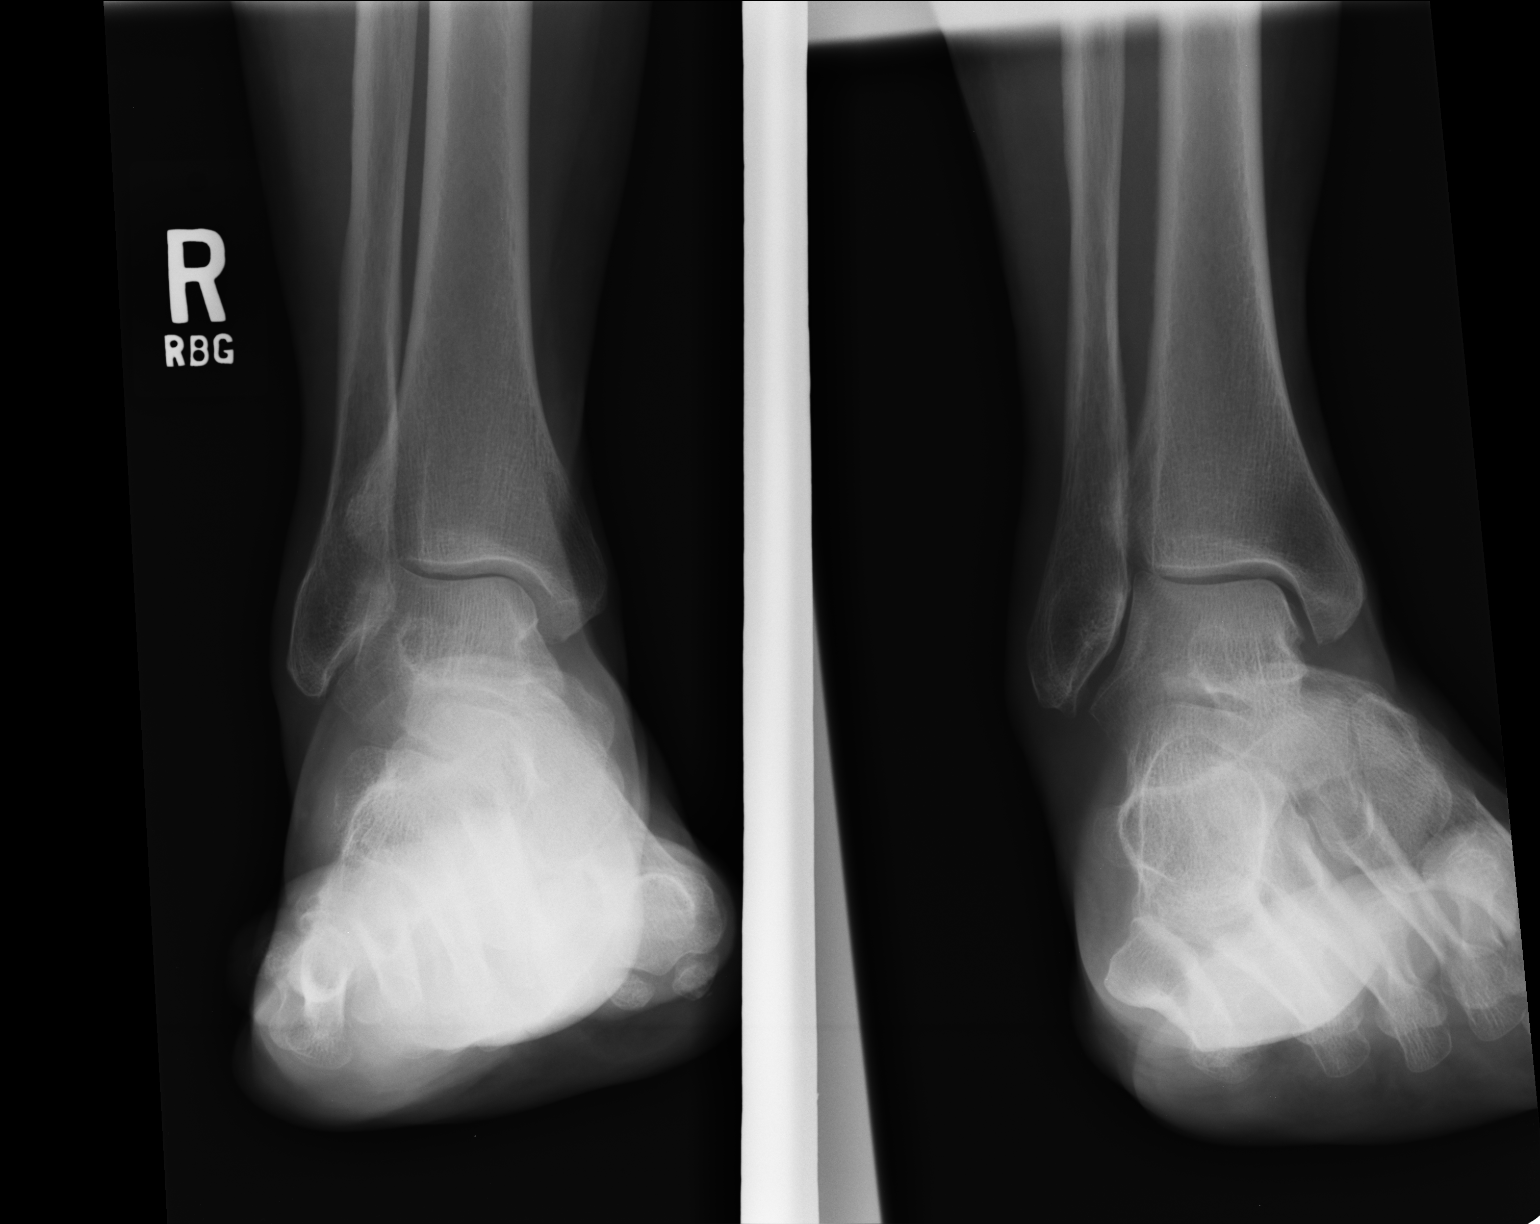

[lateral]
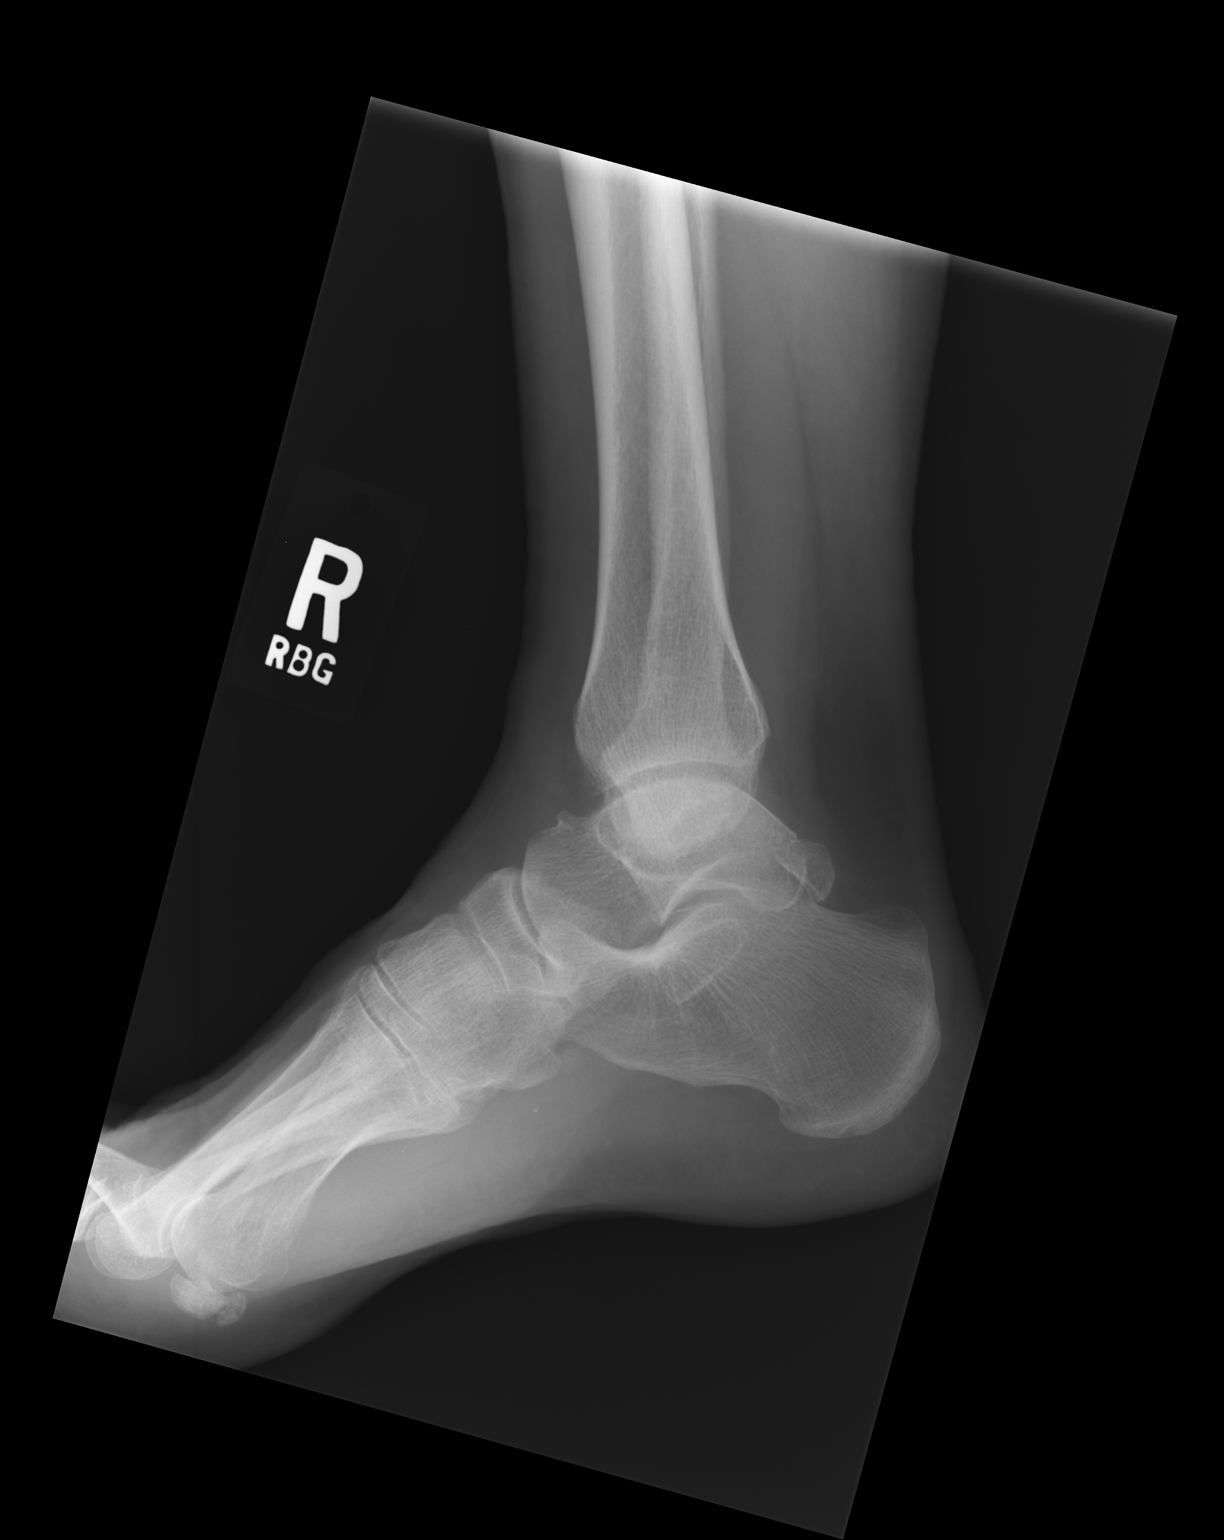

[2 of 2 positions shown; findings below may reference images not displayed]

FINDINGS: There is no evidence of fracture, dislocation, or joint effusion.
There is no evidence of arthropathy or other focal bone abnormality.
Soft tissues are unremarkable.
IMPRESSION: Negative.

## 2017-07-02 ENCOUNTER — Other Ambulatory Visit: Payer: Self-pay

## 2017-07-02 ENCOUNTER — Encounter: Payer: Self-pay | Admitting: Family Medicine

## 2017-07-02 ENCOUNTER — Ambulatory Visit: Payer: BC Managed Care – PPO | Admitting: Family Medicine

## 2017-07-02 VITALS — BP 100/58 | HR 79 | Temp 97.9°F | Ht 63.0 in | Wt 144.4 lb

## 2017-07-02 DIAGNOSIS — K648 Other hemorrhoids: Secondary | ICD-10-CM

## 2017-07-02 DIAGNOSIS — I471 Supraventricular tachycardia: Secondary | ICD-10-CM | POA: Diagnosis not present

## 2017-07-02 DIAGNOSIS — R040 Epistaxis: Secondary | ICD-10-CM

## 2017-07-02 DIAGNOSIS — Z23 Encounter for immunization: Secondary | ICD-10-CM | POA: Diagnosis not present

## 2017-07-02 DIAGNOSIS — K644 Residual hemorrhoidal skin tags: Secondary | ICD-10-CM

## 2017-07-02 MED ORDER — METOPROLOL SUCCINATE ER 25 MG PO TB24: 25.0000 mg | ORAL_TABLET | Freq: Every day | ORAL | 1 refills | Status: DC

## 2017-07-02 MED ORDER — SALINE SPRAY 0.65 % NA SOLN: 1.0000 | NASAL | 2 refills | Status: DC

## 2017-07-02 MED ORDER — HYDROCORTISONE ACETATE 25 MG RE SUPP: 25.0000 mg | Freq: Two times a day (BID) | RECTAL | 1 refills | Status: DC

## 2017-07-02 MED ORDER — MUPIROCIN 2 % EX OINT: 1.0000 "application " | TOPICAL_OINTMENT | Freq: Three times a day (TID) | CUTANEOUS | 0 refills | Status: DC

## 2017-07-02 NOTE — Progress Notes (Signed)
Subjective:  By signing my name below, I, Veronica Ray, attest that this documentation has been prepared under the direction and in the presence of Norberto SorensonEva Haylei Cobin, MD Electronically Signed: Charline BillsEssence Ray, ED Scribe 07/02/2017 at 5:04 PM.   Patient ID: Veronica FearingJody Ray, female    DOB: [birth year], 62 y.o.   MRN: [id number]  Chief Complaint  Patient presents with  . Hemrrhoids    Since November on and off  . Epistaxis   HPI Veronica Ray is a [age] y.o. female who [facility] complaining of hemorrhoids x 4 months. Pt states she started anew job which required a lot of traveling. She had been eating a lot of fast food and became constipation when she initially noticed hemorrhoids. She reports occasional itching surrounding her anus that wakes her in the middle of the night and increased pain after BMs. Pt also reports minimal blood on the toiler tissue with wiping. She has tried OTC hemorrhoid cream for a few days, diaper rash cream, exposing the area to more water and cleaning the area multiple times a day without relief. She has not tried suppositories.  Recurrent Nosebleeds Pt reports intermittent minimal bleeding from the R nare x a few months. States she occasionally notices epistaxis with blowing in the mornings but reports that she was washing her hair in the shower yesterday when she bumped her head and also had a nosebleed. All nosebleed self-resolve after a few minutes. 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   Past Medical History:  Diagnosis Date  . Allergy    Current Outpatient Medications on File Prior to Visit  Medication Sig Dispense Refill  . b complex vitamins tablet Take 1 tablet by mouth daily.    . metoprolol succinate (TOPROL-XL) 25 MG 24 hr tablet Take 1 tablet (25 mg total) by mouth daily. 180 tablet 1  . Multiple Vitamins-Minerals (MULTIVITAMIN WITH MINERALS)  tablet Take 1 tablet by mouth daily.     No current facility-administered medications on file prior to visit.    No Known Allergies   Review of Systems  HENT: Positive for nosebleeds and sinus pressure.   Gastrointestinal: Positive for rectal pain (hemorrhoids).  Neurological: Positive for headaches (minimal).      Objective:   Physical Exam  Constitutional: She is oriented to person, place, and time. She appears well-developed and well-nourished. No distress.  HENT:  Head: Normocephalic and atraumatic.  Eyes: Conjunctivae and EOM are normal.  Neck: Neck supple. No tracheal deviation present.  Cardiovascular: Normal rate.  Pulmonary/Chest: Effort normal. No respiratory distress.  Musculoskeletal: Normal range of motion.  Neurological: She is alert and oriented to person, place, and time.  Skin: Skin is warm and dry.  Psychiatric: She has a normal mood and affect. Her behavior is normal.  Nursing note and vitals reviewed.  BP (!) 100/58 (BP Location: Left Arm, Patient Position: Sitting, Cuff Size: Large)   Pulse 79   Temp 97.9 F (36.6 C) (Oral)   Ht 5\' 3"  (1.6 m)   Wt 144 lb 6.4 oz (65.5 kg)   SpO2 96%   BMI 25.58 kg/m     Assessment & Plan:   1. Paroxysmal supraventricular tachycardia (HCC)   2. External hemorrhoid, bleeding   3. Internal hemorrhoids without complication   4. Mild epistaxis   5. Needs flu shot     Orders Placed This Encounter  Procedures  . Flu Vaccine QUAD 36+ mos IM    Meds ordered this encounter  Medications  . metoprolol succinate (TOPROL-XL) 25 MG 24 hr tablet    Sig: Take 1 tablet (25 mg total) by mouth daily.    Dispense:  180 tablet    Refill:  1  . mupirocin ointment (BACTROBAN) 2 %    Sig: Apply 1 application topically 3 (three) times daily. And immed before bed To right nasal mucosa x 1 wk    Dispense:  30 g    Refill:  0  . hydrocortisone (ANUSOL-HC) 25 MG suppository    Sig: Place 1 suppository (25 mg total) rectally 2 (two)  times daily.    Dispense:  24 suppository    Refill:  1  . sodium chloride (OCEAN) 0.65 % SOLN nasal spray    Sig: Place 1 spray into both nostrils every 2 (two) hours while awake.    Dispense:  30 mL    Refill:  2    I personally performed the services described in this documentation, which was scribed in my presence. The recorded information has been reviewed and considered, and addended by me as needed.   Norberto Sorenson, M.D.  Primary Care at [facility] [address] [place], [place] [address] [phone] phone [phone] fax  01/12/18 4:32 PM

## 2017-07-02 NOTE — Patient Instructions (Addendum)
Apply witchhazel cotton ball or pad to your rectum   IF you received an x-ray today, you will receive an invoice from [facility]. Please contact [facility] at [phone] with questions or concerns regarding your invoice.   IF you received labwork today, you will receive an invoice from [facility]. Please contact [facility] at [phone] with questions or concerns regarding your invoice.   Our billing staff will not be able to assist you with questions regarding bills from these companies.  You will be contacted with the lab results as soon as they are available. The fastest way to get your results is to activate your My Chart account. Instructions are located on the last page of this paperwork. If you have not heard from [place] regarding the results in 2 weeks, please contact this office.     Hemorrhoids Hemorrhoids are swollen veins in and around the rectum or anus. There are two types of hemorrhoids:  Internal hemorrhoids. These occur in the veins that are just inside the rectum. They may poke through to the outside and become irritated and painful.  External hemorrhoids. These occur in the veins that are outside of the anus and can be felt as a painful swelling or hard lump near the anus.  Most hemorrhoids do not cause serious problems, and they can be managed with home treatments such as diet and lifestyle changes. If home treatments do not help your symptoms, procedures can be done to shrink or remove the hemorrhoids. What are the causes? This condition is caused by increased pressure in the anal area. This pressure may result from various things, including:  Constipation.  Straining to have a bowel movement.  Diarrhea.  Pregnancy.  Obesity.  Sitting for long periods of time.  Heavy lifting or other activity that causes you to strain.  Anal sex.  What are the signs or symptoms? Symptoms of this condition include:  Pain.  Anal itching or  irritation.  Rectal bleeding.  Leakage of stool (feces).  Anal swelling.  One or more lumps around the anus.  How is this diagnosed? This condition can often be diagnosed through a visual exam. Other exams or tests may also be done, such as:  Examination of the rectal area with a gloved hand (digital rectal exam).  Examination of the anal canal using a small tube (anoscope).  A blood test, if you have lost a significant amount of blood.  A test to look inside the colon (sigmoidoscopy or colonoscopy).  How is this treated? This condition can usually be treated at home. However, various procedures may be done if dietary changes, lifestyle changes, and other home treatments do not help your symptoms. These procedures can help make the hemorrhoids smaller or remove them completely. Some of these procedures involve surgery, and others do not. Common procedures include:  Rubber band ligation. Rubber bands are placed at the base of the hemorrhoids to cut off the blood supply to them.  Sclerotherapy. Medicine is injected into the hemorrhoids to shrink them.  Infrared coagulation. A type of light energy is used to get rid of the hemorrhoids.  Hemorrhoidectomy surgery. The hemorrhoids are surgically removed, and the veins that supply them are tied off.  Stapled hemorrhoidopexy surgery. A circular stapling device is used to remove the hemorrhoids and use staples to cut off the blood supply to them.  Follow these instructions at home: Eating and drinking  Eat foods that have a lot of fiber in them, such as whole grains, beans,  nuts, fruits, and vegetables. Ask your health care provider about taking products that have added fiber (fiber supplements).  Drink enough fluid to keep your urine clear or pale yellow. Managing pain and swelling  Take warm sitz baths for 20 minutes, 3-4 times a day to ease pain and discomfort.  If directed, apply ice to the affected area. Using ice packs  between sitz baths may be helpful. ? Put ice in a plastic bag. ? Place a towel between your skin and the bag. ? Leave the ice on for 20 minutes, 2-3 times a day. General instructions  Take over-the-counter and prescription medicines only as told by your health care provider.  Use medicated creams or suppositories as told.  Exercise regularly.  Go to the bathroom when you have the urge to have a bowel movement. Do not wait.  Avoid straining to have bowel movements.  Keep the anal area dry and clean. Use wet toilet paper or moist towelettes after a bowel movement.  Do not sit on the toilet for long periods of time. This increases blood pooling and pain. Contact a health care provider if:  You have increasing pain and swelling that are not controlled by treatment or medicine.  You have uncontrolled bleeding.  You have difficulty having a bowel movement, or you are unable to have a bowel movement.  You have pain or inflammation outside the area of the hemorrhoids. This information is not intended to replace advice given to you by your health care provider. Make sure you discuss any questions you have with your health care provider. Document Released: 04/04/2000 Document Revised: 09/05/2015 Document Reviewed: 12/20/2014 Elsevier Interactive Patient Education  2018 [employer].  About Hemorrhoids  Hemorrhoids are swollen veins in the lower rectum and anus.  Also called piles, hemorrhoids are a common problem.  Hemorrhoids may be internal (inside the rectum) or external (around the anus).  Internal Hemorrhoids  Internal hemorrhoids are often painless, but they rarely cause bleeding.  The internal veins may stretch and fall down (prolapse) through the anus to the outside of the body.  The veins may then become irritated and painful.  External Hemorrhoids  External hemorrhoids can be easily seen or felt around the anal opening.  They are under the skin around the anus.  When the  swollen veins are scratched or broken by straining, rubbing or wiping they sometimes bleed.  How Hemorrhoids Occur  Veins in the rectum and around the anus tend to swell under pressure.  Hemorrhoids can result from increased pressure in the veins of your anus or rectum.  Some sources of pressure are:   Straining to have a bowel movement because of constipation  Waiting too long to have a bowel movement  Coughing and sneezing often  Sitting for extended periods of time, including on the toilet  Diarrhea  Obesity  Trauma or injury to the anus  Some liver diseases  Stress  Family history of hemorrhoids  Pregnancy  Pregnant women should try to avoid becoming constipated, because they are more likely to have hemorrhoids during pregnancy.  In the last trimester of pregnancy, the enlarged uterus may press on blood vessels and causes hemorrhoids.  In addition, the strain of childbirth sometimes causes hemorrhoids after the birth.  Symptoms of Hemorrhoids  Some symptoms of hemorrhoids include:  Swelling and/or a tender lump around the anus  Itching, mild burning and bleeding around the anus  Painful bowel movements with or without constipation  Bright red blood covering  the stool, on toilet paper or in the toilet bowel.   Symptoms usually go away within a few days.  Always talk to your doctor about any bleeding to make sure it is not from some other causes.  Diagnosing and Treating Hemorrhoids  Diagnosis is made by an examination by your healthcare provider.  Special test can be performed by your doctor.    Most cases of hemorrhoids can be treated with:  High-fiber diet: Eat more high-fiber foods, which help prevent constipation.  Ask for more detailed fiber information on types and sources of fiber from your healthcare provider.  Fluids: Drink plenty of water.  This helps soften bowel movements so they are easier to pass.  Sitz baths and cold packs: Sitting in lukewarm  water two or three times a day for 15 minutes cleases the anal area and may relieve discomfort.  If the water is too hot, swelling around the anus will get worse.  Placing a cloth-covered ice pack on the anus for ten minutes four times a day can also help reduce selling.  Gently pushing a prolapsed hemorrhoid back inside after the bath or ice pack can be helpful.  Medications: For mild discomfort, your healthcare provider may suggest over-the-counter pain medication or prescribe a cream or ointment for topical use.  The cream may contain witch hazel, zinc oxide or petroleum jelly.  Medicated suppositories are also a treatment option.  Always consult your doctor before applying medications or creams.  Procedures and surgeries: There are also a number of procedures and surgeries to shrink or remove hemorrhoids in more serious cases.  Talk to your physician about these options.  You can often prevent hemorrhoids or keep them from becoming worse by maintaining a healthy lifestyle.  Eat a fiber-rich diet of fruits, vegetables and whole grains.  Also, drink plenty of water and exercise regularly.   2007, [employer] Doc.30  Nonsurgical Procedures for Hemorrhoids Nonsurgical procedures can be used to treat hemorrhoids. Hemorrhoids are swollen veins that are inside the rectum (internal hemorrhoids) or around the anus (external hemorrhoids). They are caused by increased pressure in the anal area. This pressure may result from straining to have a bowel movement (constipation), diarrhea, pregnancy, obesity, anal sex, or sitting for long periods of time. Hemorrhoids can cause symptoms such as pain and bleeding. Various procedures may be performed if diet changes, lifestyle changes, and other treatments do not help your symptoms. Some of these procedures do not involve surgery. Three common nonsurgical procedures are:  Rubber band ligation. Rubber bands are used to cut off the blood supply to the  hemorrhoids.  Sclerotherapy. Medicine is injected into the hemorrhoids to shrink them.  Infrared coagulation. A type of light energy is used to get rid of the hemorrhoids.  Tell a health care provider about:  Any allergies you have.  All medicines you are taking, including vitamins, herbs, eye drops, creams, and over-the-counter medicines.  Any problems you or family members have had with anesthetic medicines.  Any blood disorders you have.  Any surgeries you have had.  Any medical conditions you have.  Whether you are pregnant or may be pregnant. What are the risks? Generally, this is a safe procedure. However, problems may occur, including:  Infection.  Bleeding.  Pain.  What happens before the procedure?  Ask your health care provider about: ? Changing or stopping your regular medicines. This is especially important if you are taking diabetes medicines or blood thinners. ? Taking medicines such as  aspirin and ibuprofen. These medicines can thin your blood. Do not take these medicines before your procedure if your health care provider instructs you not to.  You may need to have a procedure to examine the inside of your colon with a scope (colonoscopy). Your health care provider may do this to make sure that there are no other causes for your bleeding or pain. What happens during the procedure?  Your health care provider will clean your rectal area with a rinsing solution.  A lubricating jelly may be placed into your rectum. The jelly may contain a medicine to numb the area (local anesthetic).  Your health care provider will insert a short scope (anoscope) into your rectum to examine the hemorrhoids.  One of the following techniques will be used. Rubber Band Ligation Your health care provider will place medical instruments through the scope to put rubber bands around the base of your hemorrhoids. The bands will cut off the blood supply to the hemorrhoids. The  hemorrhoids will fall off after several days. Sclerotherapy Your health care provider will inject medicine through the scope into your hemorrhoids. This will cause them to shrink and dry up. Infrared Coagulation Your health care provider will shine a type of light through the scope onto your hemorrhoids. This light will generate energy (infrared radiation). It will cause the hemorrhoids to scar and then fall off. Each of these procedures may vary among health care providers and hospitals. What happens after the procedure?  You will be monitored to make sure that you have no bleeding.  Return to your normal activities as told by your health care provider. This information is not intended to replace advice given to you by your health care provider. Make sure you discuss any questions you have with your health care provider. Document Released: 02/02/2009 Document Revised: 09/13/2015 Document Reviewed: 07/03/2014 Elsevier Interactive Patient Education  2018 [employer] Hematology

## 2017-12-17 ENCOUNTER — Ambulatory Visit: Payer: Self-pay | Admitting: Family Medicine

## 2017-12-17 NOTE — Telephone Encounter (Signed)
Patient is calling to report that [place]. She had episode of burning discomfort that traveled from her waist to chest to neck on the left today while she was sitting in office. It only lasted seconds and she recovered quickly. Patient only risk factor is that she just got off plane after over 6 hour trip. Patient does report some belching after the episode- but she does not remember sour taste in mouth. 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 Reviewed all risk factors and she understands. She will gp to ED if she has reoccurrence of any of the things that happened today- or any new symptoms- pain with breathing. Appointment scheduled in office and office notified. Reason for Disposition . Recent long-distance travel with prolonged time in car, bus, plane, or train (i.e., within past 2 weeks; 6 or  more hours duration)  Answer Assessment - Initial Assessment Questions 1. LOCATION: "Where does it hurt?"       Left side around waist into chest and neck 2. RADIATION: "Does the pain go anywhere else?" (e.g., into neck, jaw, arms, back)     Yes- started low and traveled up 3. ONSET: "When did the chest pain begin?" (Minutes, hours or days)      30 minutes ago 4. PATTERN "Does the pain come and go, or has it been constant since it started?"  "Does it get worse with exertion?"      Come and go- constant discomfort- not present now 5. DURATION: "How long does it last" (e.g., seconds, minutes, hours)     Less than 1 minute total 6. SEVERITY: "How bad is the pain?"  (e.g., Scale 1-10; mild, moderate, or severe)    - MILD (1-3): doesn't interfere with normal activities     - MODERATE (4-7): interferes with normal activities or awakens from sleep    - SEVERE (8-10): excruciating pain, unable to do any normal activities       At worst-6-7 7. CARDIAC RISK FACTORS: "Do you have any history of heart problems or risk factors for heart disease?" (e.g.,  prior heart attack, angina; high blood pressure, diabetes, being overweight, high cholesterol, smoking, or strong family history of heart disease)     Cardiac medication 8. PULMONARY RISK FACTORS: "Do you have any history of lung disease?"  (e.g., blood clots in lung, asthma, emphysema, birth control pills)     Air plane travel- recent 9. CAUSE: "What do you think is causing the chest pain?"     Muscular vs indigestion 10. OTHER SYMPTOMS: "Do you have any other symptoms?" (e.g., dizziness, nausea, vomiting, sweating, fever, difficulty breathing, cough)       no 11. PREGNANCY: "Is there any chance you are pregnant?" "When was your last menstrual period?"       n/a  Protocols used: CHEST PAIN-A-AH

## 2017-12-18 ENCOUNTER — Other Ambulatory Visit: Payer: Self-pay | Admitting: Physician Assistant

## 2017-12-18 ENCOUNTER — Other Ambulatory Visit: Payer: Self-pay

## 2017-12-18 ENCOUNTER — Ambulatory Visit: Payer: BC Managed Care – PPO | Admitting: Physician Assistant

## 2017-12-18 ENCOUNTER — Encounter: Payer: Self-pay | Admitting: Physician Assistant

## 2017-12-18 ENCOUNTER — Telehealth: Payer: Self-pay

## 2017-12-18 VITALS — BP 107/70 | HR 67 | Temp 98.6°F | Resp 17 | Ht 63.0 in | Wt 140.0 lb

## 2017-12-18 DIAGNOSIS — R079 Chest pain, unspecified: Secondary | ICD-10-CM

## 2017-12-18 DIAGNOSIS — R7989 Other specified abnormal findings of blood chemistry: Secondary | ICD-10-CM

## 2017-12-18 LAB — D-DIMER, QUANTITATIVE: D-DIMER: 0.33 mg/L FEU (ref 0.00–0.49)

## 2017-12-18 MED ORDER — HYDROCORTISONE ACETATE 25 MG RE SUPP
25.0000 mg | Freq: Two times a day (BID) | RECTAL | 1 refills | Status: DC
Start: 2017-12-18 — End: 2019-01-18

## 2017-12-18 NOTE — Telephone Encounter (Signed)
Spoke with pt per Barnett AbuWiseman an advised D-Dimer result normal and we will call her with her other lab results.  Pt agreeable and appreciated c/b. Dgaddy, CMA

## 2017-12-18 NOTE — Patient Instructions (Signed)
We are checking labs today. Your symptoms are most suspicious for reflux. I will contact you later today with your stat labs and within a week with the other labs. While awaiting these results, if you start to have chest pain, blurred vision, shortness of breath, severe headache, lower leg swelling, or nausea/vomiting please seek care immediately at the ED.   In the future, document what you ate prior to having similar episodes and keep a food diary. Once you identify aggravating foods, can take an over the counter zantac or priolosec when eating those avoids to avoid symptoms.  Thank you for letting me participate in your health and well being.     Food Choices for Gastroesophageal Reflux Disease, Adult When you have gastroesophageal reflux disease (GERD), the foods you eat and your eating habits are very important. Choosing the right foods can help ease your discomfort. What guidelines do I need to follow?  Choose fruits, vegetables, whole grains, and low-fat dairy products.  Choose low-fat meat, fish, and poultry.  Limit fats such as oils, salad dressings, butter, nuts, and avocado.  Keep a food diary. This helps you identify foods that cause symptoms.  Avoid foods that cause symptoms. These may be different for everyone.  Eat small meals often instead of 3 large meals a day.  Eat your meals slowly, in a place where you are relaxed.  Limit fried foods.  Cook foods using methods other than frying.  Avoid drinking alcohol.  Avoid drinking large amounts of liquids with your meals.  Avoid bending over or lying down until 2-3 hours after eating. What foods are not recommended? These are some foods and drinks that may make your symptoms worse: Vegetables Tomatoes. Tomato juice. Tomato and spaghetti sauce. Chili peppers. Onion and garlic. Horseradish. Fruits Oranges, grapefruit, and lemon (fruit and juice). Meats High-fat meats, fish, and poultry. This includes hot dogs, ribs,  ham, sausage, salami, and bacon. Dairy Whole milk and chocolate milk. Sour cream. Cream. Butter. Ice cream. Cream cheese. Drinks Coffee and tea. Bubbly (carbonated) drinks or energy drinks. Condiments Hot sauce. Barbecue sauce. Sweets/Desserts Chocolate and cocoa. Donuts. Peppermint and spearmint. Fats and Oils High-fat foods. This includes [place] fries and potato chips. Other Vinegar. Strong spices. This includes black pepper, white pepper, red pepper, cayenne, curry powder, cloves, ginger, and chili powder. The items listed above may not be a complete list of foods and drinks to avoid. Contact your dietitian for more information. This information is not intended to replace advice given to you by your health care provider. Make sure you discuss any questions you have with your health care provider. Document Released: 10/07/2011 Document Revised: 09/13/2015 Document Reviewed: 02/09/2013 Elsevier Interactive Patient Education  2017 [employer]

## 2017-12-18 NOTE — Progress Notes (Signed)
Veronica Ray  MRN: [id number] DOB: 08-25-1955  Subjective:  Veronica Ray is a 63 y.o. female with PMH of SVT seen in office today for a chief complaint of episodic chest pain 1 day ago, not present now. It occurred after she drove to [place] and just ate breakfast (eggs, vegetables, onion, apple, and zucchini). She was just sitting there talking to a person and then felt "heat like sensation" in epigastric region, traveling up chest into right neck. Last about one minute then resolved. Rated a 6/10.  Symptoms have resolved since that time. Does admit to eating more spicy food like [place] food in [place], also had soda on the way home,which she typically does not have. Feels like this has happened before in the past. Denies associated SOB, N/V, diaphoresis, claudication, dyspnea, exertional chest pressure/discomfort, fatigue, irregular heart beat, lower extremity edema, near-syncope, orthopnea, palpitations, paroxysmal nocturnal dyspnea, syncope and tachypnea. Aggravating factors are: none. Alleviating factors are: none. Patient's cardiac risk factors are: none. Patient's risk factors for DVT/PE:  long duration of automobile or plane travel. 2 days ago was on a 9 hr flight to [place] then 1.5 hr car ride to [place].  Denies hemoptysis, calf pain, estrogen replacement therapy, history of deep venous thrombosis, history of pulmonary embolus, history of malignancy,oral contraceptive use, prolonged stay in bed, recent limb injury or fracture, recent pregnancy, recent surgery and varicose veins. Previous cardiac testing: exercise stress test ~5-6 hours ago, which was normal. She is very active, does lots of yard work, cycles at least 5 miles at least 3 x a week, mows her 1 acre yard, walks daily. Has never had exertional chest pain. Denies smoking. No PMH of GERD, HTN, heart disease, HLD, diabetes, or stroke. No FH of MI in mother or father.   Review of Systems  Per HPI  Patient Active Problem List   Diagnosis Date Noted  . Paroxysmal SVT (supraventricular tachycardia) ([place]) 02/24/2013    Current Outpatient Medications on File Prior to Visit  Medication Sig Dispense Refill  . b complex vitamins tablet Take 1 tablet by mouth daily.    . hydrocortisone (ANUSOL-HC) 25 MG suppository Place 1 suppository (25 mg total) rectally 2 (two) times daily. 24 suppository 1  . metoprolol succinate (TOPROL-XL) 25 MG 24 hr tablet Take 1 tablet (25 mg total) by mouth daily. 180 tablet 1  . Multiple Vitamins-Minerals (MULTIVITAMIN WITH MINERALS) tablet Take 1 tablet by mouth daily.    . sodium chloride (OCEAN) 0.65 % SOLN nasal spray Place 1 spray into both nostrils every 2 (two) hours while awake. 30 mL 2  . mupirocin ointment (BACTROBAN) 2 % Apply 1 application topically 3 (three) times daily. And immed before bed To right nasal mucosa x 1 wk (Patient not taking: Reported on 12/18/2017) 30 g 0   No current facility-administered medications on file prior to visit.     No Known Allergies    Social History   Socioeconomic History  . Marital status: Married    Spouse name: Not on file  . Number of children: Not on file  . Years of education: Not on file  . Highest education level: Not on file  Occupational History  . Not on file  Social Needs  . Financial resource strain: Not on file  . Food insecurity:    Worry: Not on file    Inability: Not on file  . Transportation needs:    Medical: Not on file    Non-medical: Not on file  Tobacco Use  . Smoking status: Never Smoker  . Smokeless tobacco: Never Used  Substance and Sexual Activity  . Alcohol use: No  . Drug use: No  . Sexual activity: Not on file  Lifestyle  . Physical activity:    Days per week: Not on file    Minutes per session: Not on file  . Stress: Not on file  Relationships  . Social connections:    Talks on phone: Not on file    Gets together: Not on file    Attends religious service: Not on file    Active member of club  or organization: Not on file    Attends meetings of clubs or organizations: Not on file    Relationship status: Not on file  . Intimate partner violence:    Fear of current or ex partner: Not on file    Emotionally abused: Not on file    Physically abused: Not on file    Forced sexual activity: Not on file  Other Topics Concern  . Not on file  Social History Narrative  . Not on file    Objective:   BP 107/70 (BP Location: Left Arm, Patient Position: Sitting, Cuff Size: Large)   Pulse 67   Temp 98.6 F (37 C) (Oral)   Resp 17   Ht _0  (1.6 m)   Wt 140 lb (63.5 kg)   SpO2 99%   BMI 24.80 kg/m    Physical Exam  Constitutional: She is oriented to person, place, and time and well-developed, well-nourished, and in no distress. No distress.  HENT:  Head: Normocephalic and atraumatic.  Eyes: Conjunctivae are normal.  Neck: Normal range of motion. No thyromegaly present.  Cardiovascular: Normal rate, regular rhythm, normal heart sounds and intact distal pulses.  Pulmonary/Chest: Effort normal and breath sounds normal. No respiratory distress. She has no wheezes. She has no rhonchi. She has no rales. She exhibits no tenderness and no bony tenderness.  Musculoskeletal:  Calf measurements are equal bilaterally.  No overlying erythema or warmth on bilateral lower extremity exam.  Neurological: She is alert and oriented to person, place, and time. Gait normal.  Skin: Skin is warm and dry.  Psychiatric: Affect normal.  Vitals reviewed.   EKG shows NSR with rate of 60 bpm. PR and QRS intervals within normal limits. No ST or T wave abnormalities. No prior EKG for comparison.    Wells Criteria for Pulmonary Embolism  Clinical signs and symptoms of DVT: No (0 points) PE is #1 diagnosis, or equally likely: No (0 points) Heart rate over 100: No (0 points) Immobilization at least 3 days, or surgery in past 4 weeks: No (0 points) Previous, objectively diagnosed PE or DVT: No (0  points) Hemoptysis: No (0 points) Malignancy w/ treatment within 6 mo, or palliative: No (0 points)  0 Points. Low risk group: 1.3% chance PE Rock Nephew PS). Or PE unlikely, 3% incidence Eliberto Ivory [place]).    Assessment and Plan :  1. Chest pain, unspecified type Patient is overall well-appearing, no acute distress.  Vital stable.  Normal physical exam.  Chest pain is atypical in nature, unlikely cardiac etiology, not currently symptomatic.   History most consistent with episode of heartburn. No cardiac risk factors.  EKG normal.  Patient has low risk for PE, however due to recent long travel, will obtain stat d-dimer at this time.  Other labs pending.   Will contact patient with d-dimer results and discuss further treatment plan.  Given strict  ED precautions. - EKG 12-Lead - CBC with Differential/Platelet - CMP14+EGFR - Lipid panel - TSH - D-dimer, quantitative (not at [facility])  2. Low TSH level - T3, Free - T4, Free  [place] PA-C  [facility] 12/18/2017 10:06 AM

## 2017-12-19 LAB — LIPID PANEL
CHOL/HDL RATIO: 2.9 ratio (ref 0.0–4.4)
CHOLESTEROL TOTAL: 172 mg/dL (ref 100–199)
HDL: 60 mg/dL (ref >39)
LDL Calculated: 96 mg/dL (ref 0–99)
Triglycerides: 82 mg/dL (ref 0–149)
VLDL Cholesterol Cal: 16 mg/dL (ref 5–40)

## 2017-12-19 LAB — CBC WITH DIFFERENTIAL/PLATELET
Basophils Absolute: 0.1 10*3/uL (ref 0.0–0.2)
Basos: 1 %
EOS (ABSOLUTE): 0.2 10*3/uL (ref 0.0–0.4)
EOS: 3 %
HEMATOCRIT: 40.7 % (ref 34.0–46.6)
HEMOGLOBIN: 13.8 g/dL (ref 11.1–15.9)
IMMATURE GRANULOCYTES: 0 %
Immature Grans (Abs): 0 10*3/uL (ref 0.0–0.1)
Lymphocytes Absolute: 1.4 10*3/uL (ref 0.7–3.1)
Lymphs: 24 %
MCH: 30.7 pg (ref 26.6–33.0)
MCHC: 33.9 g/dL (ref 31.5–35.7)
MCV: 90 fL (ref 79–97)
MONOCYTES: 10 %
MONOS ABS: 0.6 10*3/uL (ref 0.1–0.9)
NEUTROS PCT: 62 %
Neutrophils Absolute: 3.7 10*3/uL (ref 1.4–7.0)
Platelets: 183 10*3/uL (ref 150–450)
RBC: 4.5 x10E6/uL (ref 3.77–5.28)
RDW: 12 % — AB (ref 12.3–15.4)
WBC: 6 10*3/uL (ref 3.4–10.8)

## 2017-12-19 LAB — CMP14+EGFR
A/G RATIO: 2.1 (ref 1.2–2.2)
ALBUMIN: 4.4 g/dL (ref 3.6–4.8)
ALT: 19 IU/L (ref 0–32)
AST: 29 IU/L (ref 0–40)
Alkaline Phosphatase: 96 IU/L (ref 39–117)
BUN/Creatinine Ratio: 17 (ref 12–28)
BUN: 13 mg/dL (ref 8–27)
Bilirubin Total: 0.8 mg/dL (ref 0.0–1.2)
CALCIUM: 9.5 mg/dL (ref 8.7–10.3)
CHLORIDE: 105 mmol/L (ref 96–106)
CO2: 24 mmol/L (ref 20–29)
CREATININE: 0.75 mg/dL (ref 0.57–1.00)
GFR calc Af Amer: 99 mL/min/{1.73_m2} (ref >59)
GFR, EST NON AFRICAN AMERICAN: 86 mL/min/{1.73_m2} (ref >59)
GLOBULIN, TOTAL: 2.1 g/dL (ref 1.5–4.5)
Glucose: 78 mg/dL (ref 65–99)
Potassium: 4.7 mmol/L (ref 3.5–5.2)
SODIUM: 143 mmol/L (ref 134–144)
Total Protein: 6.5 g/dL (ref 6.0–8.5)

## 2017-12-19 LAB — TSH: TSH: 0.433 u[IU]/mL — ABNORMAL LOW (ref 0.450–4.500)

## 2017-12-20 LAB — T4, FREE: FREE T4: 1.15 ng/dL (ref 0.82–1.77)

## 2017-12-20 LAB — T3, FREE: T3, Free: 3.1 pg/mL (ref 2.0–4.4)

## 2017-12-24 NOTE — Telephone Encounter (Signed)
Pt seen on 12/18/17 by wiseman. Dgaddy, CMA

## 2018-01-12 DIAGNOSIS — K644 Residual hemorrhoidal skin tags: Secondary | ICD-10-CM | POA: Insufficient documentation

## 2018-04-06 ENCOUNTER — Other Ambulatory Visit: Payer: Self-pay | Admitting: Family Medicine

## 2018-04-06 DIAGNOSIS — I471 Supraventricular tachycardia: Secondary | ICD-10-CM

## 2018-04-06 NOTE — Telephone Encounter (Signed)
Requested Prescriptions  Pending Prescriptions Disp Refills  . metoprolol succinate (TOPROL-XL) 25 MG 24 hr tablet [Pharmacy Med Name: METOPROLO ER TAB SUC 25MG ] 90 tablet 3    Sig: TAKE 1 TABLET DAILY     Cardiovascular:  Beta Blockers Passed - 04/06/2018 12:14 PM      Passed - Last BP in normal range    BP Readings from Last 1 Encounters:  12/18/17 107/70         Passed - Last Heart Rate in normal range    Pulse Readings from Last 1 Encounters:  12/18/17 67         Passed - Valid encounter within last 6 months    Recent Outpatient Visits          3 months ago Chest pain, unspecified type   [facility], [place]ittany D, PA-C   9 months ago Paroxysmal supraventricular tachycardia [facility](HCC)   [facility], Levell JulyEva N, MD   1 year ago Paroxysmal supraventricular tachycardia [facility](HCC)   [facility] Clark, Marolyn HammockMichael L, PA-C   2 years ago Acute right-sided low back pain without sciatica   [facility], Marolyn HammockMichael L, PA-C   2 years ago Annual physical exam   [facility], Binnie Raileborah B, FNP

## 2018-04-28 ENCOUNTER — Ambulatory Visit (INDEPENDENT_AMBULATORY_CARE_PROVIDER_SITE_OTHER): Payer: BC Managed Care – PPO | Admitting: Family Medicine

## 2018-04-28 DIAGNOSIS — Z23 Encounter for immunization: Secondary | ICD-10-CM

## 2018-09-20 NOTE — Progress Notes (Signed)
Virtual Visit via Video Note  I connected with Veronica Ray on 09/21/2018 at  2:30 PM EDT by a video enabled telemedicine application and verified that I am speaking with the correct person using two identifiers.  Location: Patient:Home Provider: In Clinic   I discussed the limitations of evaluation and management by telemedicine and the availability of in person appointments. The patient expressed understanding and agreed to proceed.  History of Present Illness: Ms. Veronica Ray is using WebEx to establish as a new pt.  She is a pleasant 63 year old female. PMH: HTN, Hx of SVT (treated with BB), Onychomycosis of the toenails, Hemorrhoids She feels that her overall health is quite good. She drinks >80 oz water/day and follows heart healthy diet. She walks daily and rides a road bike 5-10 miles 5 miles/week. Her mother passed from a brain aneurysm when she was age 73 She has not had an annual physical >3 years. 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 She and her husband have been strictly adhering to Stay at [facility] Order, wearing masks when out of their home  Patient Care Team    Relationship Specialty Notifications Start End  Sherren Mocha, MD PCP - General Family Medicine  02/24/13     Patient Active Problem List   Diagnosis Date Noted  . Healthcare maintenance 09/21/2018  . Onychomycosis 09/21/2018  . External hemorrhoid, bleeding 01/12/2018  . Paroxysmal SVT (supraventricular tachycardia) (H[facility]) 02/24/2013     Past Medical History:  Diagnosis Date  . Allergy      Past Surgical History:  Procedure Laterality Date  . CESAREAN SECTION       Family History  Problem Relation Age of Onset  . Cancer Paternal Grandmother   . Cancer Paternal Grandfather      Social History   Substance and Sexual Activity  Drug Use No     Social History   Substance and Sexual Activity  Alcohol Use No     Social  History   Tobacco Use  Smoking Status Never Smoker  Smokeless Tobacco Never Used     Outpatient Encounter Medications as of 09/21/2018  Medication Sig  . b complex vitamins tablet Take 1 tablet by mouth daily.  . hydrocortisone (ANUSOL-HC) 25 MG suppository Place 1 suppository (25 mg total) rectally 2 (two) times daily.  . metoprolol succinate (TOPROL-XL) 25 MG 24 hr tablet TAKE 1 TABLET DAILY  . Multiple Vitamins-Minerals (MULTIVITAMIN WITH MINERALS) tablet Take 1 tablet by mouth daily.  . sodium chloride (OCEAN) 0.65 % SOLN nasal spray Place 1 spray into both nostrils every 2 (two) hours while awake.  . mupirocin ointment (BACTROBAN) 2 % Apply 1 application topically 3 (three) times daily. And immed before bed To right nasal mucosa x 1 wk (Patient not taking: Reported on 12/18/2017)  . Terbinafine 1 % GEL Apply 1 Dose topically 2 (two) times daily.   No facility-administered encounter medications on file as of 09/21/2018.     Allergies: Patient has no known allergies.  There is no height or weight on file to calculate BMI.  There were no vitals taken for this visit. Review of Systems: General:   Denies fever, chills, unexplained weight loss.  Optho/Auditory:   Denies visual changes, blurred vision/LOV Respiratory:   Denies SOB, DOE more than baseline levels.  Cardiovascular:   Denies chest pain, palpitations, new onset peripheral edema  Gastrointestinal:   Denies nausea, vomiting, diarrhea.  Genitourinary: Denies dysuria,  freq/ urgency, flank pain or discharge from genitals.  Endocrine:     Denies hot or cold intolerance, polyuria, polydipsia. Musculoskeletal:   Denies unexplained myalgias, joint swelling, unexplained arthralgias, gait problems.  Skin:  Denies rash, suspicious lesions Neurological:     Denies dizziness, unexplained weakness, numbness  Psychiatric/Behavioral:   Denies mood changes, suicidal or homicidal ideations, hallucinations This patient does not have sx  concerning for COVID-19 Infection (ie; fever, chills, cough, new or worsening shortness of breath).  Observations/Objective: No acute distress noted over the WebEx call Assessment and Plan: Continue all medications as directed. Sent in Terbinafine (Lamisil) 1% gel to treat Onychomycosis Remain well hydrated, follow Mediterranean diet, continue regular exercise   Follow Up Instructions: 2 months CPE, fasting labs the week prior   I discussed the assessment and treatment plan with the patient. The patient was provided an opportunity to ask questions and all were answered. The patient agreed with the plan and demonstrated an understanding of the instructions.   The patient was advised to call back or seek an in-person evaluation if the symptoms worsen or if the condition fails to improve as anticipated.  I provided 22 minutes of non-face-to-face time during this encounter.   Julaine FusiKaty D Amdrew Oboyle, NP

## 2018-09-21 ENCOUNTER — Ambulatory Visit (INDEPENDENT_AMBULATORY_CARE_PROVIDER_SITE_OTHER): Payer: BC Managed Care – PPO | Admitting: Adult Health

## 2018-09-21 ENCOUNTER — Other Ambulatory Visit: Payer: Self-pay

## 2018-09-21 ENCOUNTER — Encounter: Payer: Self-pay | Admitting: Adult Health

## 2018-09-21 VITALS — BP 98/59 | HR 69 | Temp 99.3°F

## 2018-09-21 DIAGNOSIS — Z0000 Encounter for general adult medical examination without abnormal findings: Secondary | ICD-10-CM | POA: Insufficient documentation

## 2018-09-21 DIAGNOSIS — B351 Tinea unguium: Secondary | ICD-10-CM | POA: Insufficient documentation

## 2018-09-21 DIAGNOSIS — K644 Residual hemorrhoidal skin tags: Secondary | ICD-10-CM | POA: Diagnosis not present

## 2018-09-21 MED ORDER — TERBINAFINE 1 % EX GEL: 1.0000 | Freq: Two times a day (BID) | CUTANEOUS | 0 refills | Status: DC

## 2018-09-21 NOTE — Assessment & Plan Note (Signed)
Assessment and Plan: Continue all medications as directed. Sent in Terbinafine (Lamisil) 1% gel to treat Onychomycosis Remain well hydrated, follow Mediterranean diet, continue regular exercise   Follow Up Instructions: 2 months CPE, fasting labs the week prior   I discussed the assessment and treatment plan with the patient. The patient was provided an opportunity to ask questions and all were answered. The patient agreed with the plan and demonstrated an understanding of the instructions.   The patient was advised to call back or seek an in-person evaluation if the symptoms worsen or if the condition fails to improve as anticipated.

## 2018-09-21 NOTE — Assessment & Plan Note (Addendum)
Hydrocortisone (Anusol-HC) 25mg  supp Remain well hydrated, eat plenty of fiber

## 2018-09-21 NOTE — Assessment & Plan Note (Signed)
Sent in Terbinafine (Lamisil) 1% gel to treat Onychomycosis

## 2018-10-02 ENCOUNTER — Encounter: Payer: Self-pay | Admitting: Adult Health

## 2018-10-04 ENCOUNTER — Other Ambulatory Visit: Payer: Self-pay | Admitting: Adult Health

## 2018-10-04 MED ORDER — CICLOPIROX 8 % EX SOLN: Freq: Every day | CUTANEOUS | 0 refills | Status: DC

## 2018-10-12 ENCOUNTER — Telehealth: Payer: Self-pay

## 2018-10-12 NOTE — Telephone Encounter (Signed)
Received fax from [facility] that Ciclopirox prior authorization was denied, stating that she would need to meet all of the following conditions: Specific fungal infection of the nail ID'd Test to confirm nail fungus  LVM requesting pt to return call to see if she wishes to have sample of toenail sent for testing.  Charyl Bigger, CMA

## 2018-10-13 NOTE — Telephone Encounter (Signed)
Spoke with Veronica Ray who states that she will wait at this time for testing and will discuss with [name] further if needed at her CPE on 11/15/2018.  Charyl Bigger, CMA

## 2018-11-10 ENCOUNTER — Other Ambulatory Visit (INDEPENDENT_AMBULATORY_CARE_PROVIDER_SITE_OTHER): Payer: BC Managed Care – PPO

## 2018-11-10 ENCOUNTER — Other Ambulatory Visit: Payer: Self-pay

## 2018-11-10 DIAGNOSIS — Z0000 Encounter for general adult medical examination without abnormal findings: Secondary | ICD-10-CM

## 2018-11-11 LAB — CBC WITH DIFFERENTIAL/PLATELET
Basophils Absolute: 0.1 10*3/uL (ref 0.0–0.2)
Basos: 1 %
EOS (ABSOLUTE): 0.1 10*3/uL (ref 0.0–0.4)
Eos: 3 %
Hematocrit: 42.4 % (ref 34.0–46.6)
Hemoglobin: 13.7 g/dL (ref 11.1–15.9)
Immature Grans (Abs): 0 10*3/uL (ref 0.0–0.1)
Immature Granulocytes: 0 %
Lymphocytes Absolute: 1.6 10*3/uL (ref 0.7–3.1)
Lymphs: 34 %
MCH: 30.2 pg (ref 26.6–33.0)
MCHC: 32.3 g/dL (ref 31.5–35.7)
MCV: 93 fL (ref 79–97)
Monocytes Absolute: 0.4 10*3/uL (ref 0.1–0.9)
Monocytes: 9 %
Neutrophils Absolute: 2.5 10*3/uL (ref 1.4–7.0)
Neutrophils: 53 %
Platelets: 190 10*3/uL (ref 150–450)
RBC: 4.54 x10E6/uL (ref 3.77–5.28)
RDW: 12.1 % (ref 11.7–15.4)
WBC: 4.7 10*3/uL (ref 3.4–10.8)

## 2018-11-11 LAB — COMPREHENSIVE METABOLIC PANEL
ALT: 17 IU/L (ref 0–32)
AST: 22 IU/L (ref 0–40)
Albumin/Globulin Ratio: 2.1 (ref 1.2–2.2)
Albumin: 4.5 g/dL (ref 3.8–4.8)
Alkaline Phosphatase: 93 IU/L (ref 39–117)
BUN/Creatinine Ratio: 22 (ref 12–28)
BUN: 14 mg/dL (ref 8–27)
Bilirubin Total: 1 mg/dL (ref 0.0–1.2)
CO2: 24 mmol/L (ref 20–29)
Calcium: 9.5 mg/dL (ref 8.7–10.3)
Chloride: 104 mmol/L (ref 96–106)
Creatinine, Ser: 0.63 mg/dL (ref 0.57–1.00)
GFR calc Af Amer: 110 mL/min/{1.73_m2} (ref >59)
GFR calc non Af Amer: 96 mL/min/{1.73_m2} (ref >59)
Globulin, Total: 2.1 g/dL (ref 1.5–4.5)
Glucose: 96 mg/dL (ref 65–99)
Potassium: 4.5 mmol/L (ref 3.5–5.2)
Sodium: 141 mmol/L (ref 134–144)
Total Protein: 6.6 g/dL (ref 6.0–8.5)

## 2018-11-11 LAB — LIPID PANEL
Chol/HDL Ratio: 3.2 ratio (ref 0.0–4.4)
Cholesterol, Total: 184 mg/dL (ref 100–199)
HDL: 58 mg/dL (ref >39)
LDL Calculated: 106 mg/dL — ABNORMAL HIGH (ref 0–99)
Triglycerides: 101 mg/dL (ref 0–149)
VLDL Cholesterol Cal: 20 mg/dL (ref 5–40)

## 2018-11-11 LAB — HEMOGLOBIN A1C
Est. average glucose Bld gHb Est-mCnc: 108 mg/dL
Hgb A1c MFr Bld: 5.4 % (ref 4.8–5.6)

## 2018-11-11 LAB — TSH: TSH: 0.925 u[IU]/mL (ref 0.450–4.500)

## 2018-11-11 NOTE — Progress Notes (Signed)
Subjective:    Patient ID: Veronica FearingJody Ray, female    DOB: 02-05-1956, 63 y.o.   MRN: [id number]  HPI:  [name] is here for CPE She continues to ride her bike 5 days a week- 7 miles She drinks >60 oz water/day She continues to abstain from tobacco/vape/ETOH use  11/10/2018 Labs: CBC-stable CMP-stable A1c-WNL, 5.4 TSH-WNL, 0.925 The 10-year ASCVD risk score Denman George(Goff DC Jr., et al., 2013) is: 2.5%   Values used to calculate the score:     Age: [age] years     Sex: Female     Is Non-Hispanic African American: No     Diabetic: No     Tobacco smoker: No     Systolic Blood Pressure: 98 mmHg     Is BP treated: No     HDL Cholesterol: 58 mg/dL     Total Cholesterol: 184 mg/dL [id number]  Healthcare Maintenance: PAP-updated today Mammogram-ordered Colonoscopy-tracking down report LDCT-n/a Immunizations-UTD   Patient Care Team    Relationship Specialty Notifications Start End  Julaine Fusianford,  D, NP PCP - General Family Medicine  10/05/18     Patient Active Problem List   Diagnosis Date Noted  . Healthcare maintenance 09/21/2018  . Onychomycosis 09/21/2018  . External hemorrhoid, bleeding 01/12/2018  . Paroxysmal SVT (supraventricular tachycardia) (H[facility]) 02/24/2013     Past Medical History:  Diagnosis Date  . Allergy      Past Surgical History:  Procedure Laterality Date  . CESAREAN SECTION       Family History  Problem Relation Age of Onset  . Cancer Paternal Grandmother   . Cancer Paternal Grandfather      Social History   Substance and Sexual Activity  Drug Use No     Social History   Substance and Sexual Activity  Alcohol Use No     Social History   Tobacco Use  Smoking Status Never Smoker  Smokeless Tobacco Never Used     Outpatient Encounter Medications as of 11/15/2018  Medication Sig  . b complex vitamins tablet Take 1 tablet by mouth daily.  . ciclopirox (PENLAC) 8 % solution Apply topically at bedtime. Apply over nail and surrounding  skin. Apply daily over previous coat. After seven (7) days, may remove with alcohol and continue cycle.  . hydrocortisone (ANUSOL-HC) 25 MG suppository Place 1 suppository (25 mg total) rectally 2 (two) times daily.  . metoprolol succinate (TOPROL-XL) 25 MG 24 hr tablet TAKE 1 TABLET DAILY  . Multiple Vitamins-Minerals (MULTIVITAMIN WITH MINERALS) tablet Take 1 tablet by mouth daily.  . mupirocin ointment (BACTROBAN) 2 % Apply 1 application topically 3 (three) times daily. And immed before bed To right nasal mucosa x 1 wk  . sodium chloride (OCEAN) 0.65 % SOLN nasal spray Place 1 spray into both nostrils every 2 (two) hours while awake.   No facility-administered encounter medications on file as of 11/15/2018.     Allergies: Patient has no known allergies.  Body mass index is 25.54 kg/m.  Blood pressure 115/64, pulse 69, temperature 98.5 F (36.9 C), temperature source Oral, height 5\' 3"  (1.6 m), weight 144 lb 3.2 oz (65.4 kg), SpO2 97 %.     Review of Systems  Constitutional: Negative for activity change, appetite change, chills, diaphoresis, fatigue, fever and unexpected weight change.  HENT: Negative for congestion.   Eyes: Negative for visual disturbance.  Respiratory: Negative for cough, chest tightness, shortness of breath and wheezing.   Cardiovascular: Negative for chest pain, palpitations and leg swelling.  Gastrointestinal: Negative for abdominal distention, anal bleeding, blood in stool, constipation, diarrhea, nausea and vomiting.  Endocrine: Negative for cold intolerance, heat intolerance, polydipsia, polyphagia and polyuria.  Genitourinary: Negative for difficulty urinating and flank pain.  Musculoskeletal: Negative for arthralgias, back pain, gait problem, joint swelling, myalgias, neck pain and neck stiffness.  Skin: Negative for color change, pallor, rash and wound.  Neurological: Negative for dizziness and headaches.  Hematological: Negative for adenopathy. Does not  bruise/bleed easily.  Psychiatric/Behavioral: Negative for agitation, behavioral problems, confusion, decreased concentration, dysphoric mood, hallucinations, self-injury, sleep disturbance and suicidal ideas. The patient is not nervous/anxious and is not hyperactive.        Objective:   Physical Exam Vitals signs and nursing note reviewed. Exam conducted with a chaperone present.  Constitutional:      General: She is not in acute distress.    Appearance: Normal appearance. She is normal weight. She is not ill-appearing, toxic-appearing or diaphoretic.  HENT:     Head: Normocephalic and atraumatic.     Right Ear: Tympanic membrane, ear canal and external ear normal. There is no impacted cerumen.     Left Ear: Tympanic membrane, ear canal and external ear normal. There is no impacted cerumen.     Nose: Nose normal. No congestion.     Mouth/Throat:     Mouth: Mucous membranes are moist.     Pharynx: No oropharyngeal exudate.  Eyes:     Extraocular Movements: Extraocular movements intact.     Conjunctiva/sclera: Conjunctivae normal.     Pupils: Pupils are equal, round, and reactive to light.  Neck:     Musculoskeletal: Normal range of motion and neck supple. No muscular tenderness.  Cardiovascular:     Rate and Rhythm: Normal rate and regular rhythm.     Pulses: Normal pulses.     Heart sounds: Normal heart sounds. No murmur. No friction rub. No gallop.   Pulmonary:     Effort: Pulmonary effort is normal. No respiratory distress.     Breath sounds: Normal breath sounds. No stridor. No wheezing, rhonchi or rales.  Chest:     Chest wall: No tenderness.     Breasts:        Right: Normal. No swelling, bleeding, inverted nipple, mass, nipple discharge, skin change or tenderness.        Left: Normal. No swelling, bleeding, inverted nipple, mass, nipple discharge, skin change or tenderness.  Abdominal:     General: Abdomen is flat. Bowel sounds are normal. There is no distension.      Palpations: Abdomen is soft. There is no mass.     Tenderness: There is no abdominal tenderness. There is no right CVA tenderness, left CVA tenderness, guarding or rebound.     Hernia: No hernia is present.  Genitourinary:    Vagina: No vaginal discharge.     Comments: Chaperone present during examination. Musculoskeletal:        General: No tenderness.  Skin:    General: Skin is warm and dry.     Capillary Refill: Capillary refill takes less than 2 seconds.  Neurological:     Mental Status: She is alert and oriented to person, place, and time.  Psychiatric:        Mood and Affect: Mood normal.        Behavior: Behavior normal.        Thought Content: Thought content normal.        Judgment: Judgment normal.  Assessment & Plan:   1. Healthcare maintenance   2. Screening for cervical cancer     Healthcare maintenance  Your blood pressure, weight, and labs look great! Continue to eat healthy, remain well hydrated, and continue to ride your bike. We will call you when your lab results are available. If you would like a referral to Gastroenterologist, re: hemorrhoids- please call the clinic.  Continue to social distance and wear a mask when in public. Follow-up in 6 months, sooner if needed.    FOLLOW-UP:  Return in about 6 months (around 05/18/2019) for Regular Follow Up, HTN.

## 2018-11-15 ENCOUNTER — Ambulatory Visit (INDEPENDENT_AMBULATORY_CARE_PROVIDER_SITE_OTHER): Payer: BC Managed Care – PPO | Admitting: Adult Health

## 2018-11-15 ENCOUNTER — Other Ambulatory Visit: Payer: Self-pay

## 2018-11-15 ENCOUNTER — Encounter: Payer: Self-pay | Admitting: Adult Health

## 2018-11-15 ENCOUNTER — Other Ambulatory Visit (HOSPITAL_COMMUNITY)
Admission: RE | Admit: 2018-11-15 | Discharge: 2018-11-15 | Disposition: A | Discharge status: Home / Self Care | Payer: BC Managed Care – PPO | Source: Ambulatory Visit | Attending: Adult Health | Admitting: Adult Health

## 2018-11-15 VITALS — BP 115/64 | HR 69 | Temp 98.5°F | Ht 63.0 in | Wt 144.2 lb

## 2018-11-15 DIAGNOSIS — Z124 Encounter for screening for malignant neoplasm of cervix: Secondary | ICD-10-CM | POA: Insufficient documentation

## 2018-11-15 DIAGNOSIS — Z0000 Encounter for general adult medical examination without abnormal findings: Secondary | ICD-10-CM

## 2018-11-15 NOTE — Assessment & Plan Note (Signed)
  Your blood pressure, weight, and labs look great! Continue to eat healthy, remain well hydrated, and continue to ride your bike. We will call you when your lab results are available. If you would like a referral to Gastroenterologist, re: hemorrhoids- please call the clinic.  Continue to social distance and wear a mask when in public. Follow-up in 6 months, sooner if needed.

## 2018-11-15 NOTE — Patient Instructions (Addendum)
Preventive Care for Adults, Female  A healthy lifestyle and preventive care can promote health and wellness. Preventive health guidelines for women include the following key practices.   A routine yearly physical is a good way to check with your health care provider about your health and preventive screening. It is a chance to share any concerns and updates on your health and to receive a thorough exam.   Visit your dentist for a routine exam and preventive care every 6 months. Brush your teeth twice a day and floss once a day. Good oral hygiene prevents tooth decay and gum disease.   The frequency of eye exams is based on your age, health, family medical history, use of contact lenses, and other factors. Follow your health care provider's recommendations for frequency of eye exams.   Eat a healthy diet. Foods like vegetables, fruits, whole grains, low-fat dairy products, and lean protein foods contain the nutrients you need without too many calories. Decrease your intake of foods high in solid fats, added sugars, and salt. Eat the right amount of calories for you.Get information about a proper diet from your health care provider, if necessary.   Regular physical exercise is one of the most important things you can do for your health. Most adults should get at least 150 minutes of moderate-intensity exercise (any activity that increases your heart rate and causes you to sweat) each week. In addition, most adults need muscle-strengthening exercises on 2 or more days a week.   Maintain a healthy weight. The body mass index (BMI) is a screening tool to identify possible weight problems. It provides an estimate of body fat based on height and weight. Your health care provider can find your BMI, and can help you achieve or maintain a healthy weight.For adults 20 years and older:   - A BMI below 18.5 is considered underweight.   - A BMI of 18.5 to 24.9 is normal.   - A BMI of 25 to 29.9 is  considered overweight.   - A BMI of 30 and above is considered obese.   Maintain normal blood lipids and cholesterol levels by exercising and minimizing your intake of trans and saturated fats.  Eat a balanced diet with plenty of fruit and vegetables. Blood tests for lipids and cholesterol should begin at age 20 and be repeated every 5 years minimum.  If your lipid or cholesterol levels are high, you are over 40, or you are at high risk for heart disease, you may need your cholesterol levels checked more frequently.Ongoing high lipid and cholesterol levels should be treated with medicines if diet and exercise are not working.   If you smoke, find out from your health care provider how to quit. If you do not use tobacco, do not start.   Lung cancer screening is recommended for adults aged 55-80 years who are at high risk for developing lung cancer because of a history of smoking. A yearly low-dose CT scan of the lungs is recommended for people who have at least a 30-pack-year history of smoking and are a current smoker or have quit within the past 15 years. A pack year of smoking is smoking an average of 1 pack of cigarettes a day for 1 year (for example: 1 pack a day for 30 years or 2 packs a day for 15 years). Yearly screening should continue until the smoker has stopped smoking for at least 15 years. Yearly screening should be stopped for people who develop a   health problem that would prevent them from having lung cancer treatment.   If you are pregnant, do not drink alcohol. If you are breastfeeding, be very cautious about drinking alcohol. If you are not pregnant and choose to drink alcohol, do not have more than 1 drink per day. One drink is considered to be 12 ounces (355 mL) of beer, 5 ounces (148 mL) of wine, or 1.5 ounces (44 mL) of liquor.   Avoid use of street drugs. Do not share needles with anyone. Ask for help if you need support or instructions about stopping the use of  drugs.   High blood pressure causes heart disease and increases the risk of stroke. Your blood pressure should be checked at least yearly.  Ongoing high blood pressure should be treated with medicines if weight loss and exercise do not work.   If you are 69-55 years old, ask your health care provider if you should take aspirin to prevent strokes.   Diabetes screening involves taking a blood sample to check your fasting blood sugar level. This should be done once every 3 years, after age 38, if you are within normal weight and without risk factors for diabetes. Testing should be considered at a younger age or be carried out more frequently if you are overweight and have at least 1 risk factor for diabetes.   Breast cancer screening is essential preventive care for women. You should practice "breast self-awareness."  This means understanding the normal appearance and feel of your breasts and may include breast self-examination.  Any changes detected, no matter how small, should be reported to a health care provider.  Women in their 80s and 30s should have a clinical breast exam (CBE) by a health care provider as part of a regular health exam every 1 to 3 years.  After age 66, women should have a CBE every year.  Starting at age 1, women should consider having a mammogram (breast X-ray test) every year.  Women who have a family history of breast cancer should talk to their health care provider about genetic screening.  Women at a high risk of breast cancer should talk to their health care providers about having an MRI and a mammogram every year.   -Breast cancer gene (BRCA)-related cancer risk assessment is recommended for women who have family members with BRCA-related cancers. BRCA-related cancers include breast, ovarian, tubal, and peritoneal cancers. Having family members with these cancers may be associated with an increased risk for harmful changes (mutations) in the breast cancer genes BRCA1 and  BRCA2. Results of the assessment will determine the need for genetic counseling and BRCA1 and BRCA2 testing.   The Pap test is a screening test for cervical cancer. A Pap test can show cell changes on the cervix that might become cervical cancer if left untreated. A Pap test is a procedure in which cells are obtained and examined from the lower end of the uterus (cervix).   - Women should have a Pap test starting at age 57.   - Between ages 90 and 70, Pap tests should be repeated every 2 years.   - Beginning at age 63, you should have a Pap test every 3 years as long as the past 3 Pap tests have been normal.   - Some women have medical problems that increase the chance of getting cervical cancer. Talk to your health care provider about these problems. It is especially important to talk to your health care provider if a  new problem develops soon after your last Pap test. In these cases, your health care provider may recommend more frequent screening and Pap tests.   - The above recommendations are the same for women who have or have not gotten the vaccine for human papillomavirus (HPV).   - If you had a hysterectomy for a problem that was not cancer or a condition that could lead to cancer, then you no longer need Pap tests. Even if you no longer need a Pap test, a regular exam is a good idea to make sure no other problems are starting.   - If you are between ages 36 and 66 years, and you have had normal Pap tests going back 10 years, you no longer need Pap tests. Even if you no longer need a Pap test, a regular exam is a good idea to make sure no other problems are starting.   - If you have had past treatment for cervical cancer or a condition that could lead to cancer, you need Pap tests and screening for cancer for at least 20 years after your treatment.   - If Pap tests have been discontinued, risk factors (such as a new sexual partner) need to be reassessed to determine if screening should  be resumed.   - The HPV test is an additional test that may be used for cervical cancer screening. The HPV test looks for the virus that can cause the cell changes on the cervix. The cells collected during the Pap test can be tested for HPV. The HPV test could be used to screen women aged 70 years and older, and should be used in women of any age who have unclear Pap test results. After the age of 67, women should have HPV testing at the same frequency as a Pap test.   Colorectal cancer can be detected and often prevented. Most routine colorectal cancer screening begins at the age of 57 years and continues through age 26 years. However, your health care provider may recommend screening at an earlier age if you have risk factors for colon cancer. On a yearly basis, your health care provider may provide home test kits to check for hidden blood in the stool.  Use of a small camera at the end of a tube, to directly examine the colon (sigmoidoscopy or colonoscopy), can detect the earliest forms of colorectal cancer. Talk to your health care provider about this at age 23, when routine screening begins. Direct exam of the colon should be repeated every 5 -10 years through age 49 years, unless early forms of pre-cancerous polyps or small growths are found.   People who are at an increased risk for hepatitis B should be screened for this virus. You are considered at high risk for hepatitis B if:  -You were born in a country where hepatitis B occurs often. Talk with your health care provider about which countries are considered high risk.  - Your parents were born in a high-risk country and you have not received a shot to protect against hepatitis B (hepatitis B vaccine).  - You have HIV or AIDS.  - You use needles to inject street drugs.  - You live with, or have sex with, someone who has Hepatitis B.  - You get hemodialysis treatment.  - You take certain medicines for conditions like cancer, organ  transplantation, and autoimmune conditions.   Hepatitis C blood testing is recommended for all people born from [birth year] through 1965 and any individual  with known risks for hepatitis C.   Practice safe sex. Use condoms and avoid high-risk sexual practices to reduce the spread of sexually transmitted infections (STIs). STIs include gonorrhea, chlamydia, syphilis, trichomonas, herpes, HPV, and human immunodeficiency virus (HIV). Herpes, HIV, and HPV are viral illnesses that have no cure. They can result in disability, cancer, and death. Sexually active women aged 25 years and younger should be checked for chlamydia. Older women with new or multiple partners should also be tested for chlamydia. Testing for other STIs is recommended if you are sexually active and at increased risk.   Osteoporosis is a disease in which the bones lose minerals and strength with aging. This can result in serious bone fractures or breaks. The risk of osteoporosis can be identified using a bone density scan. Women ages 65 years and over and women at risk for fractures or osteoporosis should discuss screening with their health care providers. Ask your health care provider whether you should take a calcium supplement or vitamin D to There are also several preventive steps women can take to avoid osteoporosis and resulting fractures or to keep osteoporosis from worsening. -->Recommendations include:  Eat a balanced diet high in fruits, vegetables, calcium, and vitamins.  Get enough calcium. The recommended total intake of is 1,200 mg daily; for best absorption, if taking supplements, divide doses into 250-500 mg doses throughout the day. Of the two types of calcium, calcium carbonate is best absorbed when taken with food but calcium citrate can be taken on an empty stomach.  Get enough vitamin D. NAMS and the National Osteoporosis Foundation recommend at least 1,000 IU per day for women age 50 and over who are at risk of vitamin D  deficiency. Vitamin D deficiency can be caused by inadequate sun exposure (for example, those who live in northern latitudes).  Avoid alcohol and smoking. Heavy alcohol intake (more than 7 drinks per week) increases the risk of falls and hip fracture and women smokers tend to lose bone more rapidly and have lower bone mass than nonsmokers. Stopping smoking is one of the most important changes women can make to improve their health and decrease risk for disease.  Be physically active every day. Weight-bearing exercise (for example, fast walking, hiking, jogging, and weight training) may strengthen bones or slow the rate of bone loss that comes with aging. Balancing and muscle-strengthening exercises can reduce the risk of falling and fracture.  Consider therapeutic medications. Currently, several types of effective drugs are available. Healthcare providers can recommend the type most appropriate for each woman.  Eliminate environmental factors that may contribute to accidents. Falls cause nearly 90% of all osteoporotic fractures, so reducing this risk is an important bone-health strategy. Measures include ample lighting, removing obstructions to walking, using nonskid rugs on floors, and placing mats and/or grab bars in showers.  Be aware of medication side effects. Some common medicines make bones weaker. These include a type of steroid drug called glucocorticoids used for arthritis and asthma, some antiseizure drugs, certain sleeping pills, treatments for endometriosis, and some cancer drugs. An overactive thyroid gland or using too much thyroid hormone for an underactive thyroid can also be a problem. If you are taking these medicines, talk to your doctor about what you can do to help protect your bones.reduce the rate of osteoporosis.    Menopause can be associated with physical symptoms and risks. Hormone replacement therapy is available to decrease symptoms and risks. You should talk to your  health care provider   about whether hormone replacement therapy is right for you.   Use sunscreen. Apply sunscreen liberally and repeatedly throughout the day. You should seek shade when your shadow is shorter than you. Protect yourself by wearing long sleeves, pants, a wide-brimmed hat, and sunglasses year round, whenever you are outdoors.   Once a month, do a whole body skin exam, using a mirror to look at the skin on your back. Tell your health care provider of new moles, moles that have irregular borders, moles that are larger than a pencil eraser, or moles that have changed in shape or color.   -Stay current with required vaccines (immunizations).   Influenza vaccine. All adults should be immunized every year.  Tetanus, diphtheria, and acellular pertussis (Td, Tdap) vaccine. Pregnant women should receive 1 dose of Tdap vaccine during each pregnancy. The dose should be obtained regardless of the length of time since the last dose. Immunization is preferred during the 27th 36th week of gestation. An adult who has not previously received Tdap or who does not know her vaccine status should receive 1 dose of Tdap. This initial dose should be followed by tetanus and diphtheria toxoids (Td) booster doses every 10 years. Adults with an unknown or incomplete history of completing a 3-dose immunization series with Td-containing vaccines should begin or complete a primary immunization series including a Tdap dose. Adults should receive a Td booster every 10 years.  Varicella vaccine. An adult without evidence of immunity to varicella should receive 2 doses or a second dose if she has previously received 1 dose. Pregnant females who do not have evidence of immunity should receive the first dose after pregnancy. This first dose should be obtained before leaving the health care facility. The second dose should be obtained 4 8 weeks after the first dose.  Human papillomavirus (HPV) vaccine. Females aged 13 26  years who have not received the vaccine previously should obtain the 3-dose series. The vaccine is not recommended for use in pregnant females. However, pregnancy testing is not needed before receiving a dose. If a female is found to be pregnant after receiving a dose, no treatment is needed. In that case, the remaining doses should be delayed until after the pregnancy. Immunization is recommended for any person with an immunocompromised condition through the age of 26 years if she did not get any or all doses earlier. During the 3-dose series, the second dose should be obtained 4 8 weeks after the first dose. The third dose should be obtained 24 weeks after the first dose and 16 weeks after the second dose.  Zoster vaccine. One dose is recommended for adults aged 60 years or older unless certain conditions are present.  Measles, mumps, and rubella (MMR) vaccine. Adults born before 1957 generally are considered immune to measles and mumps. Adults born in 1957 or later should have 1 or more doses of MMR vaccine unless there is a contraindication to the vaccine or there is laboratory evidence of immunity to each of the three diseases. A routine second dose of MMR vaccine should be obtained at least 28 days after the first dose for students attending postsecondary schools, health care workers, or international travelers. People who received inactivated measles vaccine or an unknown type of measles vaccine during 1963 1967 should receive 2 doses of MMR vaccine. People who received inactivated mumps vaccine or an unknown type of mumps vaccine before 1979 and are at high risk for mumps infection should consider immunization with 2 doses of   MMR vaccine. For females of childbearing age, rubella immunity should be determined. If there is no evidence of immunity, females who are not pregnant should be vaccinated. If there is no evidence of immunity, females who are pregnant should delay immunization until after pregnancy.  Unvaccinated health care workers born before [birth year] who lack laboratory evidence of measles, mumps, or rubella immunity or laboratory confirmation of disease should consider measles and mumps immunization with 2 doses of MMR vaccine or rubella immunization with 1 dose of MMR vaccine.  Pneumococcal 13-valent conjugate (PCV13) vaccine. When indicated, a person who is uncertain of her immunization history and has no record of immunization should receive the PCV13 vaccine. An adult aged 54 years or older who has certain medical conditions and has not been previously immunized should receive 1 dose of PCV13 vaccine. This PCV13 should be followed with a dose of pneumococcal polysaccharide (PPSV23) vaccine. The PPSV23 vaccine dose should be obtained at least 8 weeks after the dose of PCV13 vaccine. An adult aged 58 years or older who has certain medical conditions and previously received 1 or more doses of PPSV23 vaccine should receive 1 dose of PCV13. The PCV13 vaccine dose should be obtained 1 or more years after the last PPSV23 vaccine dose.  Pneumococcal polysaccharide (PPSV23) vaccine. When PCV13 is also indicated, PCV13 should be obtained first. All adults aged 58 years and older should be immunized. An adult younger than age 65 years who has certain medical conditions should be immunized. Any person who resides in a nursing home or long-term care facility should be immunized. An adult smoker should be immunized. People with an immunocompromised condition and certain other conditions should receive both PCV13 and PPSV23 vaccines. People with human immunodeficiency virus (HIV) infection should be immunized as soon as possible after diagnosis. Immunization during chemotherapy or radiation therapy should be avoided. Routine use of PPSV23 vaccine is not recommended for American Indians, [place] Natives, or people younger than 65 years unless there are medical conditions that require PPSV23 vaccine. When indicated,  people who have unknown immunization and have no record of immunization should receive PPSV23 vaccine. One-time revaccination 5 years after the first dose of PPSV23 is recommended for people aged 70 64 years who have chronic kidney failure, nephrotic syndrome, asplenia, or immunocompromised conditions. People who received 1 2 doses of PPSV23 before age 32 years should receive another dose of PPSV23 vaccine at age 96 years or later if at least 5 years have passed since the previous dose. Doses of PPSV23 are not needed for people immunized with PPSV23 at or after age 55 years.  Meningococcal vaccine. Adults with asplenia or persistent complement component deficiencies should receive 2 doses of quadrivalent meningococcal conjugate (MenACWY-D) vaccine. The doses should be obtained at least 2 months apart. Microbiologists working with certain meningococcal bacteria, [employer] recruits, people at risk during an outbreak, and people who travel to or live in countries with a high rate of meningitis should be immunized. A first-year college student up through age 58 years who is living in a residence hall should receive a dose if she did not receive a dose on or after her 16th birthday. Adults who have certain high-risk conditions should receive one or more doses of vaccine.  Hepatitis A vaccine. Adults who wish to be protected from this disease, have certain high-risk conditions, work with hepatitis A-infected animals, work in hepatitis A research labs, or travel to or work in countries with a high rate of hepatitis A should be  immunized. Adults who were previously unvaccinated and who anticipate close contact with an international adoptee during the first 60 days after arrival in the [place] from a country with a high rate of hepatitis A should be immunized.  Hepatitis B vaccine.  Adults who wish to be protected from this disease, have certain high-risk conditions, may be exposed to blood or other infectious  body fluids, are household contacts or sex partners of hepatitis B positive people, are clients or workers in certain care facilities, or travel to or work in countries with a high rate of hepatitis B should be immunized.  Haemophilus influenzae type b (Hib) vaccine. A previously unvaccinated person with asplenia or sickle cell disease or having a scheduled splenectomy should receive 1 dose of Hib vaccine. Regardless of previous immunization, a recipient of a hematopoietic stem cell transplant should receive a 3-dose series 6 12 months after her successful transplant. Hib vaccine is not recommended for adults with HIV infection.  Preventive Services / Frequency Ages 6 to 39years  Blood pressure check.** / Every 1 to 2 years.  Lipid and cholesterol check.** / Every 5 years beginning at age 39.  Clinical breast exam.** / Every 3 years for women in their 61s and 62s.  BRCA-related cancer risk assessment.** / For women who have family members with a BRCA-related cancer (breast, ovarian, tubal, or peritoneal cancers).  Pap test.** / Every 2 years from ages 47 through 85. Every 3 years starting at age 34 through age 12 or 74 with a history of 3 consecutive normal Pap tests.  HPV screening.** / Every 3 years from ages 46 through ages 43 to 54 with a history of 3 consecutive normal Pap tests.  Hepatitis C blood test.** / For any individual with known risks for hepatitis C.  Skin self-exam. / Monthly.  Influenza vaccine. / Every year.  Tetanus, diphtheria, and acellular pertussis (Tdap, Td) vaccine.** / Consult your health care provider. Pregnant women should receive 1 dose of Tdap vaccine during each pregnancy. 1 dose of Td every 10 years.  Varicella vaccine.** / Consult your health care provider. Pregnant females who do not have evidence of immunity should receive the first dose after pregnancy.  HPV vaccine. / 3 doses over 6 months, if 64 and younger. The vaccine is not recommended for use in  pregnant females. However, pregnancy testing is not needed before receiving a dose.  Measles, mumps, rubella (MMR) vaccine.** / You need at least 1 dose of MMR if you were born in 1957 or later. You may also need a 2nd dose. For females of childbearing age, rubella immunity should be determined. If there is no evidence of immunity, females who are not pregnant should be vaccinated. If there is no evidence of immunity, females who are pregnant should delay immunization until after pregnancy.  Pneumococcal 13-valent conjugate (PCV13) vaccine.** / Consult your health care provider.  Pneumococcal polysaccharide (PPSV23) vaccine.** / 1 to 2 doses if you smoke cigarettes or if you have certain conditions.  Meningococcal vaccine.** / 1 dose if you are age 71 to 37 years and a Market researcher living in a residence hall, or have one of several medical conditions, you need to get vaccinated against meningococcal disease. You may also need additional booster doses.  Hepatitis A vaccine.** / Consult your health care provider.  Hepatitis B vaccine.** / Consult your health care provider.  Haemophilus influenzae type b (Hib) vaccine.** / Consult your health care provider.  Ages 55 to 64years  Blood pressure check.** / Every 1 to 2 years.  Lipid and cholesterol check.** / Every 5 years beginning at age 20 years.  Lung cancer screening. / Every year if you are aged 55 80 years and have a 30-pack-year history of smoking and currently smoke or have quit within the past 15 years. Yearly screening is stopped once you have quit smoking for at least 15 years or develop a health problem that would prevent you from having lung cancer treatment.  Clinical breast exam.** / Every year after age 40 years.  BRCA-related cancer risk assessment.** / For women who have family members with a BRCA-related cancer (breast, ovarian, tubal, or peritoneal cancers).  Mammogram.** / Every year beginning at age 40  years and continuing for as long as you are in good health. Consult with your health care provider.  Pap test.** / Every 3 years starting at age 30 years through age 65 or 70 years with a history of 3 consecutive normal Pap tests.  HPV screening.** / Every 3 years from ages 30 years through ages 65 to 70 years with a history of 3 consecutive normal Pap tests.  Fecal occult blood test (FOBT) of stool. / Every year beginning at age 50 years and continuing until age 75 years. You may not need to do this test if you get a colonoscopy every 10 years.  Flexible sigmoidoscopy or colonoscopy.** / Every 5 years for a flexible sigmoidoscopy or every 10 years for a colonoscopy beginning at age 50 years and continuing until age 75 years.  Hepatitis C blood test.** / For all people born from 1945 through 1965 and any individual with known risks for hepatitis C.  Skin self-exam. / Monthly.  Influenza vaccine. / Every year.  Tetanus, diphtheria, and acellular pertussis (Tdap/Td) vaccine.** / Consult your health care provider. Pregnant women should receive 1 dose of Tdap vaccine during each pregnancy. 1 dose of Td every 10 years.  Varicella vaccine.** / Consult your health care provider. Pregnant females who do not have evidence of immunity should receive the first dose after pregnancy.  Zoster vaccine.** / 1 dose for adults aged 60 years or older.  Measles, mumps, rubella (MMR) vaccine.** / You need at least 1 dose of MMR if you were born in 1957 or later. You may also need a 2nd dose. For females of childbearing age, rubella immunity should be determined. If there is no evidence of immunity, females who are not pregnant should be vaccinated. If there is no evidence of immunity, females who are pregnant should delay immunization until after pregnancy.  Pneumococcal 13-valent conjugate (PCV13) vaccine.** / Consult your health care provider.  Pneumococcal polysaccharide (PPSV23) vaccine.** / 1 to 2 doses if  you smoke cigarettes or if you have certain conditions.  Meningococcal vaccine.** / Consult your health care provider.  Hepatitis A vaccine.** / Consult your health care provider.  Hepatitis B vaccine.** / Consult your health care provider.  Haemophilus influenzae type b (Hib) vaccine.** / Consult your health care provider.  Ages 65 years and over  Blood pressure check.** / Every 1 to 2 years.  Lipid and cholesterol check.** / Every 5 years beginning at age 20 years.  Lung cancer screening. / Every year if you are aged 55 80 years and have a 30-pack-year history of smoking and currently smoke or have quit within the past 15 years. Yearly screening is stopped once you have quit smoking for at least 15 years or develop a health problem that   would prevent you from having lung cancer treatment.  Clinical breast exam.** / Every year after age 103 years.  BRCA-related cancer risk assessment.** / For women who have family members with a BRCA-related cancer (breast, ovarian, tubal, or peritoneal cancers).  Mammogram.** / Every year beginning at age 36 years and continuing for as long as you are in good health. Consult with your health care provider.  Pap test.** / Every 3 years starting at age 5 years through age 85 or 10 years with 3 consecutive normal Pap tests. Testing can be stopped between 65 and 70 years with 3 consecutive normal Pap tests and no abnormal Pap or HPV tests in the past 10 years.  HPV screening.** / Every 3 years from ages 93 years through ages 70 or 45 years with a history of 3 consecutive normal Pap tests. Testing can be stopped between 65 and 70 years with 3 consecutive normal Pap tests and no abnormal Pap or HPV tests in the past 10 years.  Fecal occult blood test (FOBT) of stool. / Every year beginning at age 8 years and continuing until age 45 years. You may not need to do this test if you get a colonoscopy every 10 years.  Flexible sigmoidoscopy or colonoscopy.** /  Every 5 years for a flexible sigmoidoscopy or every 10 years for a colonoscopy beginning at age 69 years and continuing until age 68 years.  Hepatitis C blood test.** / For all people born from [birth year] through 1966 and any individual with known risks for hepatitis C.  Osteoporosis screening.** / A one-time screening for women ages 7 years and over and women at risk for fractures or osteoporosis.  Skin self-exam. / Monthly.  Influenza vaccine. / Every year.  Tetanus, diphtheria, and acellular pertussis (Tdap/Td) vaccine.** / 1 dose of Td every 10 years.  Varicella vaccine.** / Consult your health care provider.  Zoster vaccine.** / 1 dose for adults aged 5 years or older.  Pneumococcal 13-valent conjugate (PCV13) vaccine.** / Consult your health care provider.  Pneumococcal polysaccharide (PPSV23) vaccine.** / 1 dose for all adults aged 74 years and older.  Meningococcal vaccine.** / Consult your health care provider.  Hepatitis A vaccine.** / Consult your health care provider.  Hepatitis B vaccine.** / Consult your health care provider.  Haemophilus influenzae type b (Hib) vaccine.** / Consult your health care provider. ** Family history and personal history of risk and conditions may change your health care provider's recommendations. Document Released: 06/03/2001 Document Revised: 01/26/2013  [facility] Patient Information 2014 [place], [place].   EXERCISE AND DIET:  We recommended that you start or continue a regular exercise program for good health. Regular exercise means any activity that makes your heart beat faster and makes you sweat.  We recommend exercising at least 30 minutes per day at least 3 days a week, preferably 5.  We also recommend a diet low in fat and sugar / carbohydrates.  Inactivity, poor dietary choices and obesity can cause diabetes, heart attack, stroke, and kidney damage, among others.     ALCOHOL AND SMOKING:  Women should limit their alcohol intake to no  more than 7 drinks/beers/glasses of wine (combined, not each!) per week. Moderation of alcohol intake to this level decreases your risk of breast cancer and liver damage.  ( And of course, no recreational drugs are part of a healthy lifestyle.)  Also, you should not be smoking at all or even being exposed to second hand smoke. Most people know smoking can  cause cancer, and various heart and lung diseases, but did you know it also contributes to weakening of your bones?  Aging of your skin?  Yellowing of your teeth and nails?   CALCIUM AND VITAMIN D:  Adequate intake of calcium and Vitamin D are recommended.  The recommendations for exact amounts of these supplements seem to change often, but generally speaking 600 mg of calcium (either carbonate or citrate) and 800 units of Vitamin D per day seems prudent. Certain women may benefit from higher intake of Vitamin D.  If you are among these women, your doctor will have told you during your visit.     PAP SMEARS:  Pap smears, to check for cervical cancer or precancers,  have traditionally been done yearly, although recent scientific advances have shown that most women can have pap smears less often.  However, every woman still should have a physical exam from her gynecologist or primary care physician every year. It will include a breast check, inspection of the vulva and vagina to check for abnormal growths or skin changes, a visual exam of the cervix, and then an exam to evaluate the size and shape of the uterus and ovaries.  And after 64 years of age, a rectal exam is indicated to check for rectal cancers. We will also provide age appropriate advice regarding health maintenance, like when you should have certain vaccines, screening for sexually transmitted diseases, bone density testing, colonoscopy, mammograms, etc.    MAMMOGRAMS:  All women over 4 years old should have a yearly mammogram. Many facilities now offer a "3D" mammogram, which may cost  around $50 extra out of pocket. If possible,  we recommend you accept the option to have the 3D mammogram performed.  It both reduces the number of women who will be called back for extra views which then turn out to be normal, and it is better than the routine mammogram at detecting truly abnormal areas.     COLONOSCOPY:  Colonoscopy to screen for colon cancer is recommended for all women at age 14.  We know, you hate the idea of the prep.  We agree, BUT, having colon cancer and not knowing it is worse!!  Colon cancer so often starts as a polyp that can be seen and removed at colonscopy, which can quite literally save your life!  And if your first colonoscopy is normal and you have no family history of colon cancer, most women don't have to have it again for 10 years.  Once every ten years, you can do something that may end up saving your life, right?  We will be happy to help you get it scheduled when you are ready.  Be sure to check your insurance coverage so you understand how much it will cost.  It may be covered as a preventative service at no cost, but you should check your particular policy.    Your blood pressure, weight, and labs look great! Continue to eat healthy, remain well hydrated, and continue to ride your bike. We will call you when your lab results are available. If you would like a referral to Gastroenterologist, re: hemorrhoids- please call the clinic.  Continue to social distance and wear a mask when in public. Follow-up in 6 months, sooner if needed. GREAT TO SEE YOU!

## 2018-11-22 LAB — CYTOLOGY - PAP: Diagnosis: NEGATIVE

## 2018-12-14 DIAGNOSIS — E7800 Pure hypercholesterolemia, unspecified: Secondary | ICD-10-CM | POA: Insufficient documentation

## 2019-01-13 ENCOUNTER — Ambulatory Visit: Payer: BC Managed Care – PPO | Admitting: Adult Health

## 2019-01-17 NOTE — Progress Notes (Signed)
Subjective:    Patient ID: Veronica Ray, female    DOB: 1955/08/25, 63 y.o.   MRN: [id number]  HPI:  Veronica Ray is here for R pain that developed 3 weeks ago after injury at work. She reports leaning over a wooden platform for a prolonged period while cutting it in half and "felt something give way on my side".  She denies acute accident/trauma.  She was able to complete project and did not leave work early. She reports pain developed around area, pain was constant, described as "irritation", rated 6/10 initially but reduced after 7 days- none at present.   She did not exercise or ride her rode bike for 1 week after DOI, then was able to ride 7 miles, at a slower pace. She continues to ride her bike several days/week, however still cannot perform her floor/ab exercise regime. She denies cough/dyspnea/hemoptysis. She denies fever She is not tobacco/vape user  Patient Care Team    Relationship Specialty Notifications Start End  Julaine Fusi, NP PCP - General Family Medicine  10/05/18     Patient Active Problem List   Diagnosis Date Noted  . Contusion of rib on right side 01/18/2019  . Elevated LDL cholesterol level 12/14/2018  . Healthcare maintenance 09/21/2018  . Onychomycosis 09/21/2018  . External hemorrhoid, bleeding 01/12/2018  . Paroxysmal SVT (supraventricular tachycardia) (HCC) 02/24/2013     Past Medical History:  Diagnosis Date  . Allergy      Past Surgical History:  Procedure Laterality Date  . CESAREAN SECTION       Family History  Problem Relation Age of Onset  . Cancer Paternal Grandmother   . Cancer Paternal Grandfather      Social History   Substance and Sexual Activity  Drug Use No     Social History   Substance and Sexual Activity  Alcohol Use No     Social History   Tobacco Use  Smoking Status Never Smoker  Smokeless Tobacco Never Used     Outpatient Encounter Medications as of 01/18/2019  Medication Sig  . b complex  vitamins tablet Take 1 tablet by mouth daily.  . metoprolol succinate (TOPROL-XL) 25 MG 24 hr tablet TAKE 1 TABLET DAILY  . Multiple Vitamins-Minerals (MULTIVITAMIN WITH MINERALS) tablet Take 1 tablet by mouth daily.  . [DISCONTINUED] ciclopirox (PENLAC) 8 % solution Apply topically at bedtime. Apply over nail and surrounding skin. Apply daily over previous coat. After seven (7) days, may remove with alcohol and continue cycle.  . [DISCONTINUED] hydrocortisone (ANUSOL-HC) 25 MG suppository Place 1 suppository (25 mg total) rectally 2 (two) times daily.  . [DISCONTINUED] mupirocin ointment (BACTROBAN) 2 % Apply 1 application topically 3 (three) times daily. And immed before bed To right nasal mucosa x 1 wk  . [DISCONTINUED] sodium chloride (OCEAN) 0.65 % SOLN nasal spray Place 1 spray into both nostrils every 2 (two) hours while awake.   No facility-administered encounter medications on file as of 01/18/2019.     Allergies: Patient has no known allergies.  Body mass index is 24.66 kg/m.  Blood pressure 118/70, pulse 78, temperature 98.5 F (36.9 C), temperature source Oral, height 5\' 3"  (1.6 m), weight 139 lb 3.2 oz (63.1 kg), SpO2 97 %.  Review of Systems  Constitutional: Negative for activity change, appetite change, chills, diaphoresis, fatigue, fever and unexpected weight change.  Respiratory: Negative for cough, chest tightness, shortness of breath, wheezing and stridor.   Cardiovascular: Negative for chest pain, palpitations and leg swelling.  Musculoskeletal: Negative for arthralgias, back pain, gait problem, joint swelling, myalgias, neck pain and neck stiffness.  Skin: Negative for pallor, rash and wound.  Neurological: Negative for dizziness and headaches.  Hematological: Negative for adenopathy. Does not bruise/bleed easily.  Psychiatric/Behavioral: Negative for agitation, behavioral problems, confusion, decreased concentration, dysphoric mood, hallucinations, self-injury, sleep  disturbance and suicidal ideas. The patient is not nervous/anxious and is not hyperactive.        Objective:   Physical Exam Vitals signs and nursing note reviewed.  Constitutional:      General: She is not in acute distress.    Appearance: She is well-developed and normal weight. She is not ill-appearing, toxic-appearing or diaphoretic.  HENT:     Head: Normocephalic and atraumatic.  Eyes:     Extraocular Movements: Extraocular movements intact.     Pupils: Pupils are equal, round, and reactive to light.  Neck:     Musculoskeletal: Normal range of motion and neck supple.  Cardiovascular:     Rate and Rhythm: Normal rate and regular rhythm.     Heart sounds: Normal heart sounds. Heart sounds not distant. No murmur.  Pulmonary:     Effort: Pulmonary effort is normal. No tachypnea or respiratory distress.     Breath sounds: No stridor. No decreased breath sounds, wheezing, rhonchi or rales.     Comments: No tenderness or ecchymosis noted on R chest wall  Skin:    Capillary Refill: Capillary refill takes less than 2 seconds.  Neurological:     Mental Status: She is alert and oriented to person, place, and time.        Assessment & Plan:   1. Rib pain on right side   2. Need for influenza vaccination   3. Contusion of rib on right side, initial encounter     Contusion of rib on right side No tenderness or ecchymosis noted Sat on RA 97% Lungs clear to auscultation  Continue to advance activity as tolerated. She declined CXR Get help right away if you:  Have difficulty breathing or shortness of breath.  Develop a continual cough or you cough up thick or bloody sputum.  Feel nauseous or you vomit.  Have pain in your abdomen. Continue to social distance and wear a mask when in public. Call the clinic with any questions/concerns.    FOLLOW-UP:  Return if symptoms worsen or fail to improve.

## 2019-01-18 ENCOUNTER — Ambulatory Visit (INDEPENDENT_AMBULATORY_CARE_PROVIDER_SITE_OTHER): Payer: BC Managed Care – PPO | Admitting: Adult Health

## 2019-01-18 ENCOUNTER — Other Ambulatory Visit: Payer: Self-pay

## 2019-01-18 ENCOUNTER — Encounter: Payer: Self-pay | Admitting: Adult Health

## 2019-01-18 VITALS — BP 118/70 | HR 78 | Temp 98.5°F | Ht 63.0 in | Wt 139.2 lb

## 2019-01-18 DIAGNOSIS — Z23 Encounter for immunization: Secondary | ICD-10-CM | POA: Diagnosis not present

## 2019-01-18 DIAGNOSIS — S20211A Contusion of right front wall of thorax, initial encounter: Secondary | ICD-10-CM | POA: Diagnosis not present

## 2019-01-18 DIAGNOSIS — R0781 Pleurodynia: Secondary | ICD-10-CM | POA: Diagnosis not present

## 2019-01-18 NOTE — Assessment & Plan Note (Addendum)
No tenderness or ecchymosis noted Sat on RA 97% Lungs clear to auscultation  Continue to advance activity as tolerated. She declined CXR Get help right away if you:  Have difficulty breathing or shortness of breath.  Develop a continual cough or you cough up thick or bloody sputum.  Feel nauseous or you vomit.  Have pain in your abdomen. Continue to social distance and wear a mask when in public. Call the clinic with any questions/concerns.

## 2019-01-18 NOTE — Patient Instructions (Signed)
Rib Contusion A rib contusion is a deep bruise on your rib area. Contusions are the result of a blunt trauma that causes bleeding and injury to the tissues under the skin. A rib contusion may involve bruising of the ribs and of the skin and muscles in the area. The skin over the contusion may turn blue, purple, or yellow. Minor injuries will give you a painless contusion. More severe contusions may stay painful and swollen for a few weeks. What are the causes? This condition is usually caused by a blow, trauma, or direct force to an area of the body. This often occurs while playing contact sports. What are the signs or symptoms? Symptoms of this condition include:  Swelling and redness of the injured area.  Discoloration of the injured area.  Tenderness and soreness of the injured area.  Pain with or without movement. How is this diagnosed? This condition may be diagnosed based on:  Your symptoms and medical history.  A physical exam.  Imaging tests-such as an X-ray, CT scan, or MRI-to determine if there were internal injuries or broken bones (fractures). How is this treated? This condition may be treated with:  Rest. This is often the best treatment for a rib contusion.  Icing. This reduces swelling and inflammation.  Deep-breathing exercises. These may be recommended to reduce the risk for lung collapse and pneumonia.  Medicines. Over-the-counter or prescription medicines may be given to control pain.  Injection of a numbing medicine around the nerve near your injury (nerve block). Follow these instructions at home:     Medicines  Take over-the-counter and prescription medicines only as told by your health care provider.  Do not drive or use heavy machinery while taking prescription pain medicine.  If you are taking prescription pain medicine, take actions to prevent or treat constipation. Your health care provider may recommend that you: ? Drink enough fluid to keep  your urine pale yellow. ? Eat foods that are high in fiber, such as fresh fruits and vegetables, whole grains, and beans. ? Limit foods that are high in fat and processed sugars, such as fried or sweet foods. ? Take an over-the-counter or prescription medicine for constipation. Managing pain, stiffness, and swelling  If directed, put ice on the injured area: ? Put ice in a plastic bag. ? Place a towel between your skin and the bag. ? Leave the ice on for 20 minutes, 2-3 times a day.  Rest the injured area. Avoid strenuous activity and any activities or movements that cause pain. Be careful during activities and avoid bumping the injured area.  Do not lift anything that is heavier than 5 lb (2.3 kg), or the limit that you are told, until your health care provider says that it is safe. General instructions  Do not use any products that contain nicotine or tobacco, such as cigarettes and e-cigarettes. These can delay healing. If you need help quitting, ask your health care provider.  Do deep-breathing exercises as told by your health care provider.  If you were given an incentive spirometer, use it every 1-2 hours while you are awake, or as recommended by your health care provider. This device measures how well you are filling your lungs with each breath.  Keep all follow-up visits as told by your health care provider. This is important. Contact a health care provider if you have:  Increased bruising or swelling.  Pain that is not controlled with treatment.  A fever. Get help right away if   you:  Have difficulty breathing or shortness of breath.  Develop a continual cough or you cough up thick or bloody sputum.  Feel nauseous or you vomit.  Have pain in your abdomen. Summary  A rib contusion is a deep bruise on your rib area. Contusions are the result of a blunt trauma that causes bleeding and injury to the tissues under the skin.  The skin overlying the contusion may turn  blue, purple, or yellow. Minor injuries may give you a painless contusion. More severe contusions may stay painful and swollen for a few weeks.  Rest the injured area. Avoid strenuous activity and any activities or movements that cause pain. This information is not intended to replace advice given to you by your health care provider. Make sure you discuss any questions you have with your health care provider. Document Released: 12/31/2000 Document Revised: 05/06/2017 Document Reviewed: 05/06/2017 Elsevier Patient Education  2020 [place] to advance activity as tolerated. Get help right away if you:  Have difficulty breathing or shortness of breath.  Develop a continual cough or you cough up thick or bloody sputum.  Feel nauseous or you vomit.  Have pain in your abdomen. Continue to social distance and wear a mask when in public. Call the clinic with any questions/concerns. FEEL BETTER!

## 2019-02-04 ENCOUNTER — Telehealth: Payer: Self-pay | Admitting: Adult Health

## 2019-02-04 DIAGNOSIS — S20211A Contusion of right front wall of thorax, initial encounter: Secondary | ICD-10-CM

## 2019-02-04 NOTE — Telephone Encounter (Signed)
Patient called states during her appt last week provider offered an Chest X-ray but patient turned it down, she now wants it.  --Forwarding request to medical assistant for review with provider.  Please scheduled for (Tues )or (Thurs ) or (Friday) pt is a Automotive engineer and teaches, drive time is about hour &1/2 away from [place] request to medical asst/ referral coord for review & scheduling .  Please contact patient if any questions or concerns. --glh

## 2019-02-07 NOTE — Telephone Encounter (Signed)
LVM informing pt that order has been placed and no appt is needed, she may walk in to complete CXR.  Address of [facility] provided to pt.  Charyl Bigger, CMA

## 2019-02-08 NOTE — Telephone Encounter (Signed)
Patient called says that during last Appt provider suggested an Chest  Xray , patient turned it down (She wants [name] to order one for her) --Forwarding message to medical asst/ provider.  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  --glh

## 2019-02-09 NOTE — Telephone Encounter (Signed)
Duplicate message.  See message from 02/04/2019.  Charyl Bigger, CMA

## 2019-02-11 ENCOUNTER — Encounter: Payer: Self-pay | Admitting: Adult Health

## 2019-02-11 ENCOUNTER — Ambulatory Visit
Admission: RE | Admit: 2019-02-11 | Discharge: 2019-02-11 | Disposition: A | Discharge status: Home / Self Care | Payer: BC Managed Care – PPO | Source: Ambulatory Visit | Attending: Adult Health | Admitting: Adult Health

## 2019-02-11 ENCOUNTER — Other Ambulatory Visit: Payer: Self-pay

## 2019-02-11 DIAGNOSIS — S20211A Contusion of right front wall of thorax, initial encounter: Secondary | ICD-10-CM

## 2019-02-23 ENCOUNTER — Encounter: Payer: Self-pay | Admitting: Adult Health

## 2019-02-24 ENCOUNTER — Other Ambulatory Visit: Payer: Self-pay | Admitting: Adult Health

## 2019-02-24 DIAGNOSIS — I471 Supraventricular tachycardia: Secondary | ICD-10-CM

## 2019-02-24 MED ORDER — METOPROLOL SUCCINATE ER 25 MG PO TB24: 25.0000 mg | ORAL_TABLET | Freq: Every day | ORAL | 0 refills | Status: DC

## 2019-02-24 NOTE — Telephone Encounter (Signed)
Refill requested via [facility]. 30 day supply given. Patient message sent back to pt via mychart advising patient to schedule follow up apt per [facility]. AS, CMA

## 2019-02-25 ENCOUNTER — Other Ambulatory Visit: Payer: Self-pay

## 2019-02-25 DIAGNOSIS — I471 Supraventricular tachycardia: Secondary | ICD-10-CM

## 2019-02-25 MED ORDER — METOPROLOL SUCCINATE ER 25 MG PO TB24: 25.0000 mg | ORAL_TABLET | Freq: Every day | ORAL | 0 refills | Status: DC

## 2019-03-31 ENCOUNTER — Ambulatory Visit
Admission: RE | Admit: 2019-03-31 | Discharge: 2019-03-31 | Disposition: A | Discharge status: Home / Self Care | Payer: BC Managed Care – PPO | Source: Ambulatory Visit | Attending: Adult Health | Admitting: Adult Health

## 2019-03-31 ENCOUNTER — Other Ambulatory Visit: Payer: Self-pay | Admitting: Adult Health

## 2019-03-31 ENCOUNTER — Other Ambulatory Visit: Payer: Self-pay

## 2019-03-31 DIAGNOSIS — Z0000 Encounter for general adult medical examination without abnormal findings: Secondary | ICD-10-CM

## 2019-05-19 ENCOUNTER — Other Ambulatory Visit: Payer: Self-pay | Admitting: Adult Health

## 2019-05-19 DIAGNOSIS — I471 Supraventricular tachycardia: Secondary | ICD-10-CM

## 2019-05-23 ENCOUNTER — Encounter: Payer: Self-pay | Admitting: Adult Health

## 2019-05-23 ENCOUNTER — Other Ambulatory Visit: Payer: Self-pay

## 2019-05-23 ENCOUNTER — Ambulatory Visit (INDEPENDENT_AMBULATORY_CARE_PROVIDER_SITE_OTHER): Payer: BC Managed Care – PPO | Admitting: Adult Health

## 2019-05-23 DIAGNOSIS — E7800 Pure hypercholesterolemia, unspecified: Secondary | ICD-10-CM

## 2019-05-23 DIAGNOSIS — I471 Supraventricular tachycardia: Secondary | ICD-10-CM

## 2019-05-23 DIAGNOSIS — Z0000 Encounter for general adult medical examination without abnormal findings: Secondary | ICD-10-CM

## 2019-05-23 MED ORDER — METOPROLOL SUCCINATE ER 25 MG PO TB24: 25.0000 mg | ORAL_TABLET | Freq: Every day | ORAL | 0 refills | Status: DC

## 2019-05-23 NOTE — Assessment & Plan Note (Signed)
Continue all medications as directed. Follow Mediterranean diet. Continue your active lifestyle. Continue to social distance and wear a mask when in public. Basic information for mRNA COVID-19 vaccines Please note that these CDC guidelines can change from week to week, and even day to day.  Refer to the [facility] website for the most current recommendations for any given day at [email or web address]   1) Under the EUAs (emergency use authorization), the following age groups are authorized to receive the COVID-19 vaccination: . Pfizer-BioNTech: ages ?16 years . Moderna: ages ?18 years . Children and adolescents outside of these authorized age groups should not receive COVID-19 vaccination at this time   2) You are not eligible to receive a COVID-19 vaccine if: . You have received another vaccine in the last 14-days (example: flu shot, shingles, tetanus, etc.) . You currently have a positive test for COVID-19. The vaccine must be deferred until you have recovered from the acute illness and the criteria has been met for you to discontinue isolation. . You have received passive antibody therapy (monoclonal antibodies or convalescent serum) as treatment for COVID-19 within the past 90 days   3) Currently COVID-19 vaccines are contraindicated in patients who have had an anaphylactic reaction to polyethylene glycol ( ie. GoLYTELY bowel prep or MiraLAX ) or polysorbate products (often used as a thickening agent for many ice creams, frozen custard and whipped dessert products etc).   Also, if you had an anaphylactic reaction to your first COVID-19 vaccination, you cannot receive your second.     - Also if you are currently pregnant please check with your obstetrician to see what their recommendations are regarding vaccination.   - Also, if you have a chronic condition such as CLL, RA or other immunocompromising conditions, please know that no data are currently available (or very limited  data) on the safety and efficacy of mRNA COVID-19 vaccines in persons with autoimmune/ immunocompromising conditions.   Please seek the advice of your specialist regarding whether or not you should receive your COVID-19 vaccination.  Follow-up 6 months with primary care for complete physical with fasting labs.

## 2019-05-23 NOTE — Progress Notes (Signed)
Subjective:    Patient ID: Veronica Ray, female    DOB: [birth year], 64 y.o.   MRN: [id number]  HPI: 11/15/2018 OV:  Veronica Ray is here for CPE She continues to ride her bike 5 days a week- 7 miles She drinks >60 oz water/day She continues to abstain from tobacco/vape/ETOH use  11/10/2018 Labs: CBC-stable CMP-stable A1c-WNL, 5.4 TSH-WNL, 0.925 The 10-year ASCVD risk score Mikey Bussing DC Jr., et al., 2013) is: 2.5%   Values used to calculate the score:     Age: 64 years     Sex: Female     Is Non-Hispanic African American: No     Diabetic: No     Tobacco smoker: No     Systolic Blood Pressure: 98 mmHg     Is BP treated: No     HDL Cholesterol: 58 mg/dL     Total Cholesterol: 184 mg/dL LDL-106  05/23/2019 OV:  Veronica Ray is here 6 month f/u: SVT Metoprolol succinate 60m QD Ambulatory BP SBP 110s DBP 60-70s HR 60-70s Denies acute cardiac sx's  She continues to lead a very healthy lifestyle- follow heart healthy diet,  ride her bike 5 days a week- 7 miles She drinks >60 oz water/day  Patient Care Team    Relationship Specialty Notifications Start End  DEsaw Grandchild NP PCP - General Family Medicine  10/05/18     Patient Active Problem List   Diagnosis Date Noted  . Contusion of rib on right side 01/18/2019  . Elevated LDL cholesterol level 12/14/2018  . Healthcare maintenance 09/21/2018  . Onychomycosis 09/21/2018  . External hemorrhoid, bleeding 01/12/2018  . Paroxysmal SVT (supraventricular tachycardia) ([place] 02/24/2013     Past Medical History:  Diagnosis Date  . Allergy      Past Surgical History:  Procedure Laterality Date  . CESAREAN SECTION       Family History  Problem Relation Age of Onset  . Cancer Paternal Grandmother   . Cancer Paternal Grandfather      Social History   Substance and Sexual Activity  Drug Use No     Social History   Substance and Sexual Activity  Alcohol Use No     Social History   Tobacco Use  Smoking  Status Never Smoker  Smokeless Tobacco Never Used     Outpatient Encounter Medications as of 05/23/2019  Medication Sig  . Ascorbic Acid (VITAMIN C) 1000 MG tablet Take 1,000 mg by mouth daily.  .Marland Kitchenb complex vitamins tablet Take 1 tablet by mouth daily.  . metoprolol succinate (TOPROL-XL) 25 MG 24 hr tablet Take 1 tablet (25 mg total) by mouth daily.  . Multiple Vitamins-Minerals (MULTIVITAMIN WITH MINERALS) tablet Take 1 tablet by mouth daily.  .Marland KitchenVITAMIN D PO Take by mouth.  . zinc gluconate 50 MG tablet Take 50 mg by mouth daily.  . [DISCONTINUED] metoprolol succinate (TOPROL-XL) 25 MG 24 hr tablet Take 1 tablet (25 mg total) by mouth daily.   No facility-administered encounter medications on file as of 05/23/2019.    Allergies: Patient has no known allergies.  Body mass index is 26.57 kg/m.  Blood pressure 119/74, pulse 66, temperature 98.2 F (36.8 C), temperature source Oral, height _0  (1.6 m), weight 150 lb (68 kg), SpO2 98 %.     Review of Systems  Constitutional: Positive for fatigue. Negative for activity change, appetite change, chills, diaphoresis, fever and unexpected weight change.  HENT: Negative for congestion.   Eyes: Negative for visual disturbance.  Respiratory: Negative for cough, chest tightness, shortness of breath, wheezing and stridor.   Cardiovascular: Negative for chest pain, palpitations and leg swelling.  Endocrine: Negative for polydipsia, polyphagia and polyuria.  Musculoskeletal: Negative for arthralgias, back pain, gait problem, joint swelling, myalgias, neck pain and neck stiffness.  Neurological: Negative for dizziness and headaches.  Hematological: Negative for adenopathy. Does not bruise/bleed easily.  Psychiatric/Behavioral: Negative for agitation, behavioral problems, confusion, decreased concentration, dysphoric mood, hallucinations, self-injury, sleep disturbance and suicidal ideas. The patient is not nervous/anxious and is not hyperactive.         Objective:   Physical Exam Vitals and nursing note reviewed.  Constitutional:      General: She is not in acute distress.    Appearance: Normal appearance. She is normal weight. She is not ill-appearing, toxic-appearing or diaphoretic.  HENT:     Head: Normocephalic and atraumatic.  Eyes:     Extraocular Movements: Extraocular movements intact.     Conjunctiva/sclera: Conjunctivae normal.     Pupils: Pupils are equal, round, and reactive to light.  Cardiovascular:     Rate and Rhythm: Normal rate and regular rhythm.     Pulses: Normal pulses.     Heart sounds: Normal heart sounds. No murmur. No friction rub. No gallop.   Pulmonary:     Effort: Pulmonary effort is normal. No respiratory distress.     Breath sounds: Normal breath sounds. No stridor. No wheezing, rhonchi or rales.  Chest:     Chest wall: No tenderness.  Skin:    General: Skin is warm and dry.     Capillary Refill: Capillary refill takes less than 2 seconds.  Neurological:     Mental Status: She is alert and oriented to person, place, and time.  Psychiatric:        Mood and Affect: Mood normal.        Behavior: Behavior normal.        Thought Content: Thought content normal.        Judgment: Judgment normal.        Assessment & Plan:   1. Paroxysmal supraventricular tachycardia ([place])   2. Healthcare maintenance   3. Paroxysmal SVT (supraventricular tachycardia) ([facility])   4. Elevated LDL cholesterol level     Healthcare maintenance Continue all medications as directed. Follow Mediterranean diet. Continue your active lifestyle. Continue to social distance and wear a mask when in public. Basic information for mRNA COVID-19 vaccines Please note that these CDC guidelines can change from week to week, and even day to day.  Refer to the [facility] website for the most current recommendations for any given day at [email or web address]   1) Under the EUAs (emergency use authorization), the  following age groups are authorized to receive the COVID-19 vaccination: . Pfizer-BioNTech: ages ?16 years . Moderna: ages ?18 years . Children and adolescents outside of these authorized age groups should not receive COVID-19 vaccination at this time   2) You are not eligible to receive a COVID-19 vaccine if: . You have received another vaccine in the last 14-days (example: flu shot, shingles, tetanus, etc.) . You currently have a positive test for COVID-19. The vaccine must be deferred until you have recovered from the acute illness and the criteria has been met for you to discontinue isolation. . You have received passive antibody therapy (monoclonal antibodies or convalescent serum) as treatment for COVID-19 within the past 90 days   3) Currently COVID-19 vaccines are contraindicated in patients who have had an  anaphylactic reaction to polyethylene glycol ( ie. GoLYTELY bowel prep or MiraLAX ) or polysorbate products (often used as a thickening agent for many ice creams, frozen custard and whipped dessert products etc).   Also, if you had an anaphylactic reaction to your first COVID-19 vaccination, you cannot receive your second.     - Also if you are currently pregnant please check with your obstetrician to see what their recommendations are regarding vaccination.   - Also, if you have a chronic condition such as CLL, RA or other immunocompromising conditions, please know that no data are currently available (or very limited data) on the safety and efficacy of mRNA COVID-19 vaccines in persons with autoimmune/ immunocompromising conditions.   Please seek the advice of your specialist regarding whether or not you should receive your COVID-19 vaccination.  Follow-up 6 months with primary care for complete physical with fasting labs.  Paroxysmal SVT (supraventricular tachycardia) Metoprolol succinate 95m QD Ambulatory BP SBP 110s DBP 60-70s HR 60-70s Denies acute cardiac sx's    Elevated LDL cholesterol level Lipid panel check at CPE in 6 months      FOLLOW-UP:  Return in about 6 months (around 11/20/2019) for CPE, Fasting Labs.

## 2019-05-23 NOTE — Assessment & Plan Note (Signed)
Lipid panel check at CPE in 6 months

## 2019-05-23 NOTE — Patient Instructions (Addendum)
Mediterranean Diet A Mediterranean diet refers to food and lifestyle choices that are based on the traditions of countries located on the [employer]. This way of eating has been shown to help prevent certain conditions and improve outcomes for people who have chronic diseases, like kidney disease and heart disease. What are tips for following this plan? Lifestyle  Cook and eat meals together with your family, when possible.  Drink enough fluid to keep your urine clear or pale yellow.  Be physically active every day. This includes: ? Aerobic exercise like running or swimming. ? Leisure activities like gardening, walking, or housework.  Get 7-8 hours of sleep each night.  If recommended by your health care provider, drink red wine in moderation. This means 1 glass a day for nonpregnant women and 2 glasses a day for men. A glass of wine equals 5 oz (150 mL). Reading food labels   Check the serving size of packaged foods. For foods such as rice and pasta, the serving size refers to the amount of cooked product, not dry.  Check the total fat in packaged foods. Avoid foods that have saturated fat or trans fats.  Check the ingredients list for added sugars, such as corn syrup. Shopping  At the grocery store, buy most of your food from the areas near the walls of the store. This includes: ? Fresh fruits and vegetables (produce). ? Grains, beans, nuts, and seeds. Some of these may be available in unpackaged forms or large amounts (in bulk). ? Fresh seafood. ? Poultry and eggs. ? Low-fat dairy products.  Buy whole ingredients instead of prepackaged foods.  Buy fresh fruits and vegetables in-season from local farmers markets.  Buy frozen fruits and vegetables in resealable bags.  If you do not have access to quality fresh seafood, buy precooked frozen shrimp or canned fish, such as tuna, salmon, or sardines.  Buy small amounts of raw or cooked vegetables, salads, or olives from  the deli or salad bar at your store.  Stock your pantry so you always have certain foods on hand, such as olive oil, canned tuna, canned tomatoes, rice, pasta, and beans. Cooking  Cook foods with extra-virgin olive oil instead of using butter or other vegetable oils.  Have meat as a side dish, and have vegetables or grains as your main dish. This means having meat in small portions or adding small amounts of meat to foods like pasta or stew.  Use beans or vegetables instead of meat in common dishes like chili or lasagna.  Experiment with different cooking methods. Try roasting or broiling vegetables instead of steaming or sauteing them.  Add frozen vegetables to soups, stews, pasta, or rice.  Add nuts or seeds for added healthy fat at each meal. You can add these to yogurt, salads, or vegetable dishes.  Marinate fish or vegetables using olive oil, lemon juice, garlic, and fresh herbs. Meal planning   Plan to eat 1 vegetarian meal one day each week. Try to work up to 2 vegetarian meals, if possible.  Eat seafood 2 or more times a week.  Have healthy snacks readily available, such as: ? Vegetable sticks with hummus. ? [place] yogurt. ? Fruit and nut trail mix.  Eat balanced meals throughout the week. This includes: ? Fruit: 2-3 servings a day ? Vegetables: 4-5 servings a day ? Low-fat dairy: 2 servings a day ? Fish, poultry, or lean meat: 1 serving a day ? Beans and legumes: 2 or more servings a week ?  Nuts and seeds: 1-2 servings a day ? Whole grains: 6-8 servings a day ? Extra-virgin olive oil: 3-4 servings a day  Limit red meat and sweets to only a few servings a month What are my food choices?  Mediterranean diet ? Recommended  Grains: Whole-grain pasta. Brown rice. Bulgar wheat. Polenta. Couscous. Whole-wheat bread. [place].  Vegetables: Artichokes. Beets. Broccoli. Cabbage. Carrots. Eggplant. Green beans. Chard. Kale. Spinach. Onions. Leeks. Peas. Squash.  Tomatoes. Peppers. Radishes.  Fruits: Apples. Apricots. Avocado. Berries. Bananas. Cherries. Dates. Figs. Grapes. Lemons. Melon. Oranges. Peaches. Plums. Pomegranate.  Meats and other protein foods: Beans. Almonds. Sunflower seeds. Pine nuts. Peanuts. [place]. Salmon. Scallops. Shrimp. [place]. Tilapia. Clams. Oysters. Eggs.  Dairy: Low-fat milk. Cheese. Greek yogurt.  Beverages: Water. Red wine. Herbal tea.  Fats and oils: Extra virgin olive oil. Avocado oil. Grape seed oil.  Sweets and desserts: [place] yogurt with honey. Baked apples. Poached pears. Trail mix.  Seasoning and other foods: Basil. Cilantro. Coriander. Cumin. Mint. Parsley. Sage. Rosemary. Tarragon. Garlic. Oregano. Thyme. Pepper. Balsalmic vinegar. Tahini. Hummus. Tomato sauce. Olives. Mushrooms. ? Limit these  Grains: Prepackaged pasta or rice dishes. Prepackaged cereal with added sugar.  Vegetables: Deep fried potatoes (french fries).  Fruits: Fruit canned in syrup.  Meats and other protein foods: Beef. Pork. Lamb. Poultry with skin. Hot dogs. [place] Salines.  Dairy: Ice cream. Sour cream. Whole milk.  Beverages: Juice. Sugar-sweetened soft drinks. Beer. Liquor and spirits.  Fats and oils: Butter. Canola oil. Vegetable oil. Beef fat (tallow). Lard.  Sweets and desserts: Cookies. Cakes. Pies. Candy.  Seasoning and other foods: Mayonnaise. Premade sauces and marinades. The items listed may not be a complete list. Talk with your dietitian about what dietary choices are right for you. Summary  The Mediterranean diet includes both food and lifestyle choices.  Eat a variety of fresh fruits and vegetables, beans, nuts, seeds, and whole grains.  Limit the amount of red meat and sweets that you eat.  Talk with your health care provider about whether it is safe for you to drink red wine in moderation. This means 1 glass a day for nonpregnant women and 2 glasses a day for men. A glass of wine equals 5 oz (150 mL). This information  is not intended to replace advice given to you by your health care provider. Make sure you discuss any questions you have with your health care provider. Document Revised: 12/06/2015 Document Reviewed: 11/29/2015 Elsevier Patient Education  [place] all medications as directed. Follow Mediterranean diet. Continue your active lifestyle. Continue to social distance and wear a mask when in public. Basic information for mRNA COVID-19 vaccines Please note that these CDC guidelines can change from week to week, and even day to day.  Refer to the [facility] website for the most current recommendations for any given day at [email or web address]   1) Under the EUAs (emergency use authorization), the following age groups are authorized to receive the COVID-19 vaccination: . Pfizer-BioNTech: ages ?16 years . Moderna: ages ?18 years . Children and adolescents outside of these authorized age groups should not receive COVID-19 vaccination at this time   2) You are not eligible to receive a COVID-19 vaccine if: . You have received another vaccine in the last 14-days (example: flu shot, shingles, tetanus, etc.) . You currently have a positive test for COVID-19. The vaccine must be deferred until you have recovered from the acute illness and the criteria has been met for you to discontinue isolation. Marland Kitchen  You have received passive antibody therapy (monoclonal antibodies or convalescent serum) as treatment for COVID-19 within the past 90 days   3) Currently COVID-19 vaccines are contraindicated in patients who have had an anaphylactic reaction to polyethylene glycol ( ie. GoLYTELY bowel prep or MiraLAX ) or polysorbate products (often used as a thickening agent for many ice creams, frozen custard and whipped dessert products etc).   Also, if you had an anaphylactic reaction to your first COVID-19 vaccination, you cannot receive your second.     - Also if you are currently  pregnant please check with your obstetrician to see what their recommendations are regarding vaccination.   - Also, if you have a chronic condition such as CLL, RA or other immunocompromising conditions, please know that no data are currently available (or very limited data) on the safety and efficacy of mRNA COVID-19 vaccines in persons with autoimmune/ immunocompromising conditions.   Please seek the advice of your specialist regarding whether or not you should receive your COVID-19 vaccination.  Follow-up 6 months with primary care for complete physical with fasting labs. GREAT TO SEE YOU!

## 2019-05-23 NOTE — Assessment & Plan Note (Signed)
Metoprolol succinate 25mg  QD Ambulatory BP SBP 110s DBP 60-70s HR 60-70s Denies acute cardiac sx's

## 2019-05-25 ENCOUNTER — Telehealth: Payer: Self-pay | Admitting: Adult Health

## 2019-05-25 NOTE — Telephone Encounter (Signed)
Patient called states they are new to [place] & Orpha Bur is their PCP ( New Pt 6/20) she isn't feeling well and wants to be tested for COVID (explained we do not test @ Clinic and gave her the [facility] testing info) & also request someone advise her if she should continue to take regular Rx ( has been vomiting today & is unsure if she should take next dose as normal).  Forwarding message to med asst to contact pt @ [phone] with advice.  -glh

## 2019-05-26 ENCOUNTER — Ambulatory Visit: Payer: BC Managed Care – PPO | Attending: Internal Medicine

## 2019-05-26 DIAGNOSIS — Z20822 Contact with and (suspected) exposure to covid-19: Secondary | ICD-10-CM

## 2019-05-26 NOTE — Telephone Encounter (Signed)
05/26/2019  Pt states that N&V has subsided and she did take her regular medications today.  Tiajuana Amass, CMA

## 2019-05-27 LAB — NOVEL CORONAVIRUS, NAA: SARS-CoV-2, NAA: NOT DETECTED

## 2019-07-15 ENCOUNTER — Ambulatory Visit: Payer: BC Managed Care – PPO | Attending: Internal Medicine

## 2019-07-15 DIAGNOSIS — Z20822 Contact with and (suspected) exposure to covid-19: Secondary | ICD-10-CM

## 2019-07-16 LAB — NOVEL CORONAVIRUS, NAA: SARS-CoV-2, NAA: NOT DETECTED

## 2019-07-16 LAB — SARS-COV-2, NAA 2 DAY TAT

## 2019-08-06 ENCOUNTER — Other Ambulatory Visit: Payer: Self-pay | Admitting: Adult Health

## 2019-08-06 DIAGNOSIS — I471 Supraventricular tachycardia: Secondary | ICD-10-CM

## 2019-11-16 ENCOUNTER — Other Ambulatory Visit: Payer: Self-pay

## 2019-11-16 DIAGNOSIS — Z0000 Encounter for general adult medical examination without abnormal findings: Secondary | ICD-10-CM

## 2019-11-16 DIAGNOSIS — E7800 Pure hypercholesterolemia, unspecified: Secondary | ICD-10-CM

## 2019-11-17 ENCOUNTER — Other Ambulatory Visit: Payer: BC Managed Care – PPO

## 2019-11-17 ENCOUNTER — Other Ambulatory Visit: Payer: Self-pay

## 2019-11-17 DIAGNOSIS — Z0000 Encounter for general adult medical examination without abnormal findings: Secondary | ICD-10-CM

## 2019-11-17 DIAGNOSIS — E7800 Pure hypercholesterolemia, unspecified: Secondary | ICD-10-CM

## 2019-11-18 LAB — CBC WITH DIFFERENTIAL/PLATELET
Basophils Absolute: 0 10*3/uL (ref 0.0–0.2)
Basos: 1 %
EOS (ABSOLUTE): 0.2 10*3/uL (ref 0.0–0.4)
Eos: 4 %
Hematocrit: 39.5 % (ref 34.0–46.6)
Hemoglobin: 13 g/dL (ref 11.1–15.9)
Immature Grans (Abs): 0 10*3/uL (ref 0.0–0.1)
Immature Granulocytes: 0 %
Lymphocytes Absolute: 1.6 10*3/uL (ref 0.7–3.1)
Lymphs: 35 %
MCH: 30.6 pg (ref 26.6–33.0)
MCHC: 32.9 g/dL (ref 31.5–35.7)
MCV: 93 fL (ref 79–97)
Monocytes Absolute: 0.4 10*3/uL (ref 0.1–0.9)
Monocytes: 8 %
Neutrophils Absolute: 2.4 10*3/uL (ref 1.4–7.0)
Neutrophils: 52 %
Platelets: 177 10*3/uL (ref 150–450)
RBC: 4.25 x10E6/uL (ref 3.77–5.28)
RDW: 12.4 % (ref 11.7–15.4)
WBC: 4.6 10*3/uL (ref 3.4–10.8)

## 2019-11-18 LAB — COMPREHENSIVE METABOLIC PANEL
ALT: 16 IU/L (ref 0–32)
AST: 24 IU/L (ref 0–40)
Albumin/Globulin Ratio: 2.2 (ref 1.2–2.2)
Albumin: 4.2 g/dL (ref 3.8–4.8)
Alkaline Phosphatase: 89 IU/L (ref 48–121)
BUN/Creatinine Ratio: 26 (ref 12–28)
BUN: 16 mg/dL (ref 8–27)
Bilirubin Total: 0.8 mg/dL (ref 0.0–1.2)
CO2: 26 mmol/L (ref 20–29)
Calcium: 9.2 mg/dL (ref 8.7–10.3)
Chloride: 106 mmol/L (ref 96–106)
Creatinine, Ser: 0.62 mg/dL (ref 0.57–1.00)
GFR calc Af Amer: 110 mL/min/{1.73_m2} (ref >59)
GFR calc non Af Amer: 96 mL/min/{1.73_m2} (ref >59)
Globulin, Total: 1.9 g/dL (ref 1.5–4.5)
Glucose: 97 mg/dL (ref 65–99)
Potassium: 4.4 mmol/L (ref 3.5–5.2)
Sodium: 142 mmol/L (ref 134–144)
Total Protein: 6.1 g/dL (ref 6.0–8.5)

## 2019-11-18 LAB — TSH: TSH: 0.274 u[IU]/mL — ABNORMAL LOW (ref 0.450–4.500)

## 2019-11-18 LAB — LIPID PANEL
Chol/HDL Ratio: 3.4 ratio (ref 0.0–4.4)
Cholesterol, Total: 168 mg/dL (ref 100–199)
HDL: 50 mg/dL (ref >39)
LDL Chol Calc (NIH): 98 mg/dL (ref 0–99)
Triglycerides: 108 mg/dL (ref 0–149)
VLDL Cholesterol Cal: 20 mg/dL (ref 5–40)

## 2019-11-18 LAB — HEMOGLOBIN A1C
Est. average glucose Bld gHb Est-mCnc: 111 mg/dL
Hgb A1c MFr Bld: 5.5 % (ref 4.8–5.6)

## 2019-11-21 ENCOUNTER — Telehealth: Payer: Self-pay | Admitting: Physician Assistant

## 2019-11-21 NOTE — Telephone Encounter (Signed)
-----   Message from Mayer Masker, [place] sent at 11/18/2019  1:52 PM EDT ----- Please contact [facility] and add a free T4.  Thank you, Kandis Cocking

## 2019-11-21 NOTE — Telephone Encounter (Signed)
Lab has been added. AS, CMA 

## 2019-11-22 LAB — T4, FREE: Free T4: 1.16 ng/dL (ref 0.82–1.77)

## 2019-11-22 LAB — SPECIMEN STATUS REPORT

## 2019-11-24 ENCOUNTER — Encounter: Payer: Self-pay | Admitting: Physician Assistant

## 2019-11-24 ENCOUNTER — Other Ambulatory Visit: Payer: Self-pay

## 2019-11-24 ENCOUNTER — Ambulatory Visit (INDEPENDENT_AMBULATORY_CARE_PROVIDER_SITE_OTHER): Payer: BC Managed Care – PPO | Admitting: Physician Assistant

## 2019-11-24 VITALS — BP 112/70 | HR 71 | Temp 98.1°F | Ht 63.0 in | Wt 135.8 lb

## 2019-11-24 DIAGNOSIS — I471 Supraventricular tachycardia: Secondary | ICD-10-CM | POA: Diagnosis not present

## 2019-11-24 DIAGNOSIS — Z1211 Encounter for screening for malignant neoplasm of colon: Secondary | ICD-10-CM

## 2019-11-24 DIAGNOSIS — B351 Tinea unguium: Secondary | ICD-10-CM

## 2019-11-24 DIAGNOSIS — Z0000 Encounter for general adult medical examination without abnormal findings: Secondary | ICD-10-CM

## 2019-11-24 DIAGNOSIS — E7800 Pure hypercholesterolemia, unspecified: Secondary | ICD-10-CM | POA: Diagnosis not present

## 2019-11-24 MED ORDER — METOPROLOL SUCCINATE ER 25 MG PO TB24: 25.0000 mg | ORAL_TABLET | Freq: Every day | ORAL | 1 refills | Status: DC

## 2019-11-24 MED ORDER — CICLOPIROX OLAMINE 0.77 % EX CREA: TOPICAL_CREAM | Freq: Two times a day (BID) | CUTANEOUS | 0 refills | Status: DC

## 2019-11-24 NOTE — Progress Notes (Addendum)
Female Physical   Impression and Recommendations:    1. Screening for colon cancer      1) Anticipatory Guidance: Discussed skin CA prevention and sunscreen when outside along with skin surveillance; eating a balanced and modest diet; physical activity at least 25 minutes per day or minimum of 150 min/ week moderate to intense activity.  2) Immunizations / Screenings / Labs:   All immunizations are up-to-date per recommendations or will be updated today if pt allows.    - Patient understands with dental and vision screens they will schedule independently.  - Obtained CBC, CMP, HgA1c, Lipid panel, and TSH when fasting. Most labs were essentially wnl, thyroid labs indicate subclinical hyperthyroidism. -UTD on mammogram, Pap smear, Tdap. -Placed order for screening colonoscopy. -Declined Shingrix vaccine at this time but is considering. -Declined hep C and HIV screenings.  3) Weight:  BMI meaning discussed with patient.  Discussed goal to improve diet habits to improve overall feelings of well being and objective health data. Improve nutrient density of diet through increasing intake of fruits and vegetables and decreasing saturated fats, white flour products and refined sugars.  4) Healthcare maintenance: -Continue current medication regimen. -Follow heart healthy diet and stay as active as possible. -Stay well-hydrated. -Recommend podiatry referral if onychomycosis does not improve with Loprox cream.  Patient verbalized understanding. -Pt reports thyroid nodules have been evaluated in the past, was advised to continue to monitor. -Follow-up in 6 months for regular OV: SVT, elevated LDL, subclinical hyperthyroid   No orders of the defined types were placed in this encounter.   Orders Placed This Encounter  Procedures  . Ambulatory referral to Gastroenterology     No follow-ups on file.    Gross side effects, risk and benefits, and alternatives of medications discussed  with patient.  Patient is aware that all medications have potential side effects and we are unable to predict every side effect or drug-drug interaction that may occur.  Expresses verbal understanding and consents to current therapy plan and treatment regimen.  F-up preventative CPE in 1 year- reminded pt again, this is in addition to any chronic care visits.    Please see orders placed and AVS handed out to patient at the end of our visit for further patient instructions/ counseling done pertaining to today's office visit.  Note:  This note was prepared with assistance of Dragon voice recognition software. Occasional wrong-word or sound-a-like substitutions may have occurred due to the inherent limitations of voice recognition software.    Subjective:     CPE HPI: Veronica Ray is a 64 y.o. female who presents to [facility] today for a yearly health maintenance exam.   Health Maintenance Summary  - Reviewed and updated, unless pt declines services.  Last Cologuard or Colonoscopy:    Placed order Family history of Colon CA: No  Tobacco History Reviewed:  Y, never smoker Alcohol and/or drug use:    No concerns; no use Dental Home: Yes Eye exams: Yes Dermatology home: No  Female Health:  PAP Smear - last known results:  11/15/2018-negative STD concerns:   none Lumps or breast concerns:    none Breast Cancer Family History:   Yes, paternal aunt? Bone/ DEXA scan:    Unnecessary due to < 65 and average risk   Additional concerns beyond health maintenance issues:  Chronic toenail fungus   Immunization History  Administered Date(s) Administered  . Influenza,inj,Quad PF,6+ Mos 03/13/2014, 05/01/2016, 07/02/2017, 04/28/2018, 01/18/2019  .  Moderna SARS-COVID-2 Vaccination 06/22/2019, 07/20/2019  . Tdap 11/23/2015     Health Maintenance  Topic Date Due  . Hepatitis C Screening  Never done  . HIV Screening  Never done  . COLONOSCOPY  Never done  .  INFLUENZA VACCINE  11/20/2019  . MAMMOGRAM  03/30/2021  . PAP SMEAR-Modifier  11/14/2021  . TETANUS/TDAP  11/22/2025  . COVID-19 Vaccine  Completed     Wt Readings from Last 3 Encounters:  11/24/19 135 lb 12.8 oz (61.6 kg)  05/23/19 150 lb (68 kg)  01/18/19 139 lb 3.2 oz (63.1 kg)   BP Readings from Last 3 Encounters:  11/24/19 112/70  05/23/19 119/74  01/18/19 118/70   Pulse Readings from Last 3 Encounters:  11/24/19 71  05/23/19 66  01/18/19 78     Past Medical History:  Diagnosis Date  . Allergy       Past Surgical History:  Procedure Laterality Date  . CESAREAN SECTION        Family History  Problem Relation Age of Onset  . Cancer Paternal Grandmother   . Cancer Paternal Grandfather       Social History   Substance and Sexual Activity  Drug Use No  ,   Social History   Substance and Sexual Activity  Alcohol Use No  ,   Social History   Tobacco Use  Smoking Status Never Smoker  Smokeless Tobacco Never Used  ,   Social History   Substance and Sexual Activity  Sexual Activity Not on file    Current Outpatient Medications on File Prior to Visit  Medication Sig Dispense Refill  . Ascorbic Acid (VITAMIN C) 1000 MG tablet Take 1,000 mg by mouth daily.    Marland Kitchen b complex vitamins tablet Take 1 tablet by mouth daily.    . metoprolol succinate (TOPROL-XL) 25 MG 24 hr tablet TAKE 1 TABLET DAILY 90 tablet 1  . Multiple Vitamins-Minerals (MULTIVITAMIN WITH MINERALS) tablet Take 1 tablet by mouth daily.    Marland Kitchen VITAMIN D PO Take by mouth.    . zinc gluconate 50 MG tablet Take 50 mg by mouth daily.     No current facility-administered medications on file prior to visit.    Allergies: Patient has no known allergies.  Review of Systems: General:   Denies fever, chills, unexplained weight loss.  Optho/Auditory:   Denies visual changes, blurred vision/LOV Respiratory:   Denies SOB, DOE more than baseline levels.   Cardiovascular:   Denies chest  pain, palpitations, new onset peripheral edema  Gastrointestinal:   Denies nausea, vomiting, diarrhea.  Genitourinary: Denies dysuria, freq/ urgency, flank pain or discharge from genitals.  Endocrine:     Denies hot or cold intolerance, polyuria, polydipsia. Musculoskeletal:   Denies unexplained myalgias, joint swelling, unexplained arthralgias, gait problems.  Skin:  Denies rash, suspicious lesions Neurological:     Denies dizziness, unexplained weakness, numbness  Psychiatric/Behavioral:   Denies mood changes, suicidal or homicidal ideations, hallucinations    Objective:    Blood pressure 112/70, pulse 71, temperature 98.1 F (36.7 C), temperature source Oral, height 5\' 3"  (1.6 m), weight 135 lb 12.8 oz (61.6 kg), SpO2 99 %. Body mass index is 24.06 kg/m. General Appearance:    Alert, cooperative, no distress, appears stated age  Head:    Normocephalic, without obvious abnormality, atraumatic  Eyes:    PERRL, conjunctiva/corneas clear, EOM's intact, fundi    benign, both eyes  Ears:    Normal TM's and external ear canals, both  ears  Nose:   Nares normal, septum midline, mucosa normal, no drainage    or sinus tenderness  Throat:   Lips w/o lesion, mucosa moist, and tongue normal; teeth and   gums normal  Neck:   Supple, symmetrical, trachea midline, no adenopathy;    thyroid:  no enlargement/tenderness/R nodule noted; no carotid   bruit or JVD  Back:     Symmetric, no curvature, ROM normal, no CVA tenderness  Lungs:     Clear to auscultation bilaterally, respirations unlabored, no       Wh/ R/ R  Chest Wall:    No tenderness or gross deformity; normal excursion   Heart:    Regular rate and rhythm, S1 and S2 normal, no murmur, rub   or gallop  Breast Exam:    No tenderness, masses, or nipple abnormality b/l; no d/c  Abdomen:     Soft, non-tender, bowel sounds active all four quadrants, NO   G/R/R, no masses, no organomegaly  Skin:  Warm, dry. No obvious lesions or  rashes. Extremities: Normal, onychomycosis present  Psych:     No HI/SI , judgement and insight good, Euthymic mood.  Skin:   Warm, dry, Skin color, texture, turgor normal, no obvious rashes or lesions  Neurologic:   CNII-XII intact, normal strength, sensation and reflexes    Throughout

## 2019-11-24 NOTE — Patient Instructions (Signed)

## 2019-12-26 ENCOUNTER — Other Ambulatory Visit: Payer: Self-pay

## 2019-12-26 ENCOUNTER — Emergency Department (HOSPITAL_COMMUNITY): Payer: BC Managed Care – PPO

## 2019-12-26 ENCOUNTER — Observation Stay (HOSPITAL_COMMUNITY)
Admission: EM | Admit: 2019-12-26 | Discharge: 2019-12-28 | Disposition: A | Discharge status: Home / Self Care | Payer: BC Managed Care – PPO | Attending: Surgery | Admitting: Surgery

## 2019-12-26 ENCOUNTER — Encounter (HOSPITAL_COMMUNITY): Payer: Self-pay

## 2019-12-26 DIAGNOSIS — W19XXXA Unspecified fall, initial encounter: Secondary | ICD-10-CM | POA: Insufficient documentation

## 2019-12-26 DIAGNOSIS — Z20822 Contact with and (suspected) exposure to covid-19: Secondary | ICD-10-CM | POA: Insufficient documentation

## 2019-12-26 DIAGNOSIS — S32591A Other specified fracture of right pubis, initial encounter for closed fracture: Secondary | ICD-10-CM | POA: Diagnosis not present

## 2019-12-26 DIAGNOSIS — Y999 Unspecified external cause status: Secondary | ICD-10-CM | POA: Diagnosis not present

## 2019-12-26 DIAGNOSIS — S32592A Other specified fracture of left pubis, initial encounter for closed fracture: Secondary | ICD-10-CM

## 2019-12-26 DIAGNOSIS — Y92410 Unspecified street and highway as the place of occurrence of the external cause: Secondary | ICD-10-CM | POA: Diagnosis not present

## 2019-12-26 DIAGNOSIS — T1490XA Injury, unspecified, initial encounter: Secondary | ICD-10-CM

## 2019-12-26 DIAGNOSIS — Y939 Activity, unspecified: Secondary | ICD-10-CM | POA: Diagnosis not present

## 2019-12-26 DIAGNOSIS — Z79899 Other long term (current) drug therapy: Secondary | ICD-10-CM | POA: Insufficient documentation

## 2019-12-26 DIAGNOSIS — S329XXA Fracture of unspecified parts of lumbosacral spine and pelvis, initial encounter for closed fracture: Secondary | ICD-10-CM | POA: Diagnosis present

## 2019-12-26 DIAGNOSIS — S32501A Unspecified fracture of right pubis, initial encounter for closed fracture: Secondary | ICD-10-CM | POA: Diagnosis not present

## 2019-12-26 DIAGNOSIS — I10 Essential (primary) hypertension: Secondary | ICD-10-CM | POA: Insufficient documentation

## 2019-12-26 DIAGNOSIS — S42215A Unspecified nondisplaced fracture of surgical neck of left humerus, initial encounter for closed fracture: Principal | ICD-10-CM | POA: Insufficient documentation

## 2019-12-26 DIAGNOSIS — S32502A Unspecified fracture of left pubis, initial encounter for closed fracture: Secondary | ICD-10-CM | POA: Diagnosis not present

## 2019-12-26 DIAGNOSIS — S4992XA Unspecified injury of left shoulder and upper arm, initial encounter: Secondary | ICD-10-CM | POA: Diagnosis present

## 2019-12-26 HISTORY — DX: Supraventricular tachycardia, unspecified: I47.10

## 2019-12-26 LAB — CBC WITH DIFFERENTIAL/PLATELET
Abs Immature Granulocytes: 0.07 10*3/uL (ref 0.00–0.07)
Basophils Absolute: 0 10*3/uL (ref 0.0–0.1)
Basophils Relative: 0 %
Eosinophils Absolute: 0 10*3/uL (ref 0.0–0.5)
Eosinophils Relative: 0 %
HCT: 38.6 % (ref 36.0–46.0)
Hemoglobin: 13.1 g/dL (ref 12.0–15.0)
Immature Granulocytes: 1 %
Lymphocytes Relative: 6 %
Lymphs Abs: 0.7 10*3/uL (ref 0.7–4.0)
MCH: 31.3 pg (ref 26.0–34.0)
MCHC: 33.9 g/dL (ref 30.0–36.0)
MCV: 92.3 fL (ref 80.0–100.0)
Monocytes Absolute: 0.6 10*3/uL (ref 0.1–1.0)
Monocytes Relative: 4 %
Neutro Abs: 11.4 10*3/uL — ABNORMAL HIGH (ref 1.7–7.7)
Neutrophils Relative %: 89 %
Platelets: 153 10*3/uL (ref 150–400)
RBC: 4.18 MIL/uL (ref 3.87–5.11)
RDW: 12 % (ref 11.5–15.5)
WBC: 12.8 10*3/uL — ABNORMAL HIGH (ref 4.0–10.5)
nRBC: 0 % (ref 0.0–0.2)

## 2019-12-26 LAB — BASIC METABOLIC PANEL
Anion gap: 10 (ref 5–15)
BUN: 18 mg/dL (ref 8–23)
CO2: 24 mmol/L (ref 22–32)
Calcium: 8.9 mg/dL (ref 8.9–10.3)
Chloride: 105 mmol/L (ref 98–111)
Creatinine, Ser: 0.6 mg/dL (ref 0.44–1.00)
GFR calc Af Amer: >60 mL/min (ref >60)
GFR calc non Af Amer: >60 mL/min (ref >60)
Glucose, Bld: 124 mg/dL — ABNORMAL HIGH (ref 70–99)
Potassium: 3.8 mmol/L (ref 3.5–5.1)
Sodium: 139 mmol/L (ref 135–145)

## 2019-12-26 LAB — TYPE AND SCREEN
ABO/RH(D): O POS
Antibody Screen: NEGATIVE

## 2019-12-26 LAB — HIV ANTIBODY (ROUTINE TESTING W REFLEX): HIV Screen 4th Generation wRfx: NONREACTIVE

## 2019-12-26 LAB — SARS CORONAVIRUS 2 BY RT PCR (HOSPITAL ORDER, PERFORMED IN ~~LOC~~ HOSPITAL LAB): SARS Coronavirus 2: NEGATIVE

## 2019-12-26 LAB — ABO/RH: ABO/RH(D): O POS

## 2019-12-26 MED ORDER — HYDROMORPHONE HCL 1 MG/ML IJ SOLN
0.5000 mg | INTRAMUSCULAR | Status: DC | PRN
  Administered 2019-12-26: 0.5 mg via INTRAVENOUS
  Filled 2019-12-26: qty 1

## 2019-12-26 MED ORDER — ACETAMINOPHEN 500 MG PO TABS
1000.0000 mg | ORAL_TABLET | Freq: Four times a day (QID) | ORAL | Status: DC
  Administered 2019-12-26 – 2019-12-28 (×8): 1000 mg via ORAL
  Filled 2019-12-26 (×8): qty 2

## 2019-12-26 MED ORDER — GABAPENTIN 300 MG PO CAPS
300.0000 mg | ORAL_CAPSULE | Freq: Three times a day (TID) | ORAL | Status: DC
  Administered 2019-12-26 – 2019-12-28 (×6): 300 mg via ORAL
  Filled 2019-12-26 (×6): qty 1

## 2019-12-26 MED ORDER — OXYCODONE HCL 5 MG PO TABS
5.0000 mg | ORAL_TABLET | ORAL | Status: DC | PRN
  Administered 2019-12-26 – 2019-12-27 (×2): 5 mg via ORAL

## 2019-12-26 MED ORDER — OXYCODONE-ACETAMINOPHEN 5-325 MG PO TABS
1.0000 | ORAL_TABLET | Freq: Once | ORAL | Status: AC
  Administered 2019-12-26: 1 via ORAL
  Filled 2019-12-26: qty 1

## 2019-12-26 MED ORDER — ONDANSETRON 4 MG PO TBDP: 4.0000 mg | ORAL_TABLET | Freq: Four times a day (QID) | ORAL | Status: DC | PRN

## 2019-12-26 MED ORDER — OXYCODONE HCL 5 MG PO TABS
10.0000 mg | ORAL_TABLET | ORAL | Status: DC | PRN
  Filled 2019-12-26 (×2): qty 2

## 2019-12-26 MED ORDER — METHOCARBAMOL 1000 MG/10ML IJ SOLN
500.0000 mg | Freq: Four times a day (QID) | INTRAVENOUS | Status: DC | PRN
  Filled 2019-12-26: qty 5

## 2019-12-26 MED ORDER — ONDANSETRON HCL 4 MG/2ML IJ SOLN: 4.0000 mg | Freq: Four times a day (QID) | INTRAMUSCULAR | Status: DC | PRN

## 2019-12-26 MED ORDER — SODIUM CHLORIDE 0.9 % IV SOLN: INTRAVENOUS | Status: DC

## 2019-12-26 MED ORDER — HYDRALAZINE HCL 20 MG/ML IJ SOLN: 10.0000 mg | INTRAMUSCULAR | Status: DC | PRN

## 2019-12-26 MED ORDER — METOPROLOL SUCCINATE ER 25 MG PO TB24
25.0000 mg | ORAL_TABLET | Freq: Every day | ORAL | Status: DC
  Administered 2019-12-26 – 2019-12-28 (×3): 25 mg via ORAL
  Filled 2019-12-26 (×3): qty 1

## 2019-12-26 MED ORDER — METOPROLOL TARTRATE 5 MG/5ML IV SOLN: 5.0000 mg | Freq: Four times a day (QID) | INTRAVENOUS | Status: DC | PRN

## 2019-12-26 MED ORDER — DOCUSATE SODIUM 100 MG PO CAPS
100.0000 mg | ORAL_CAPSULE | Freq: Two times a day (BID) | ORAL | Status: DC
  Administered 2019-12-26 – 2019-12-28 (×4): 100 mg via ORAL
  Filled 2019-12-26 (×5): qty 1
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
## 2019-12-26 NOTE — ED Notes (Signed)
[facility] called for transport. 

## 2019-12-26 NOTE — ED Notes (Signed)
Attempted to call report, but RN is currently still getting report from day shift.

## 2019-12-26 NOTE — Plan of Care (Signed)
Received report from Carly at [facility]. Pt stable with controlled pain. Will continue to monitor pt.   Problem: Education: Goal: Knowledge of General Education information will improve Description: Including pain rating scale, medication(s)/side effects and non-pharmacologic comfort measures Outcome: Progressing   Problem: Pain Managment: Goal: General experience of comfort will improve Outcome: Progressing   Problem: Safety: Goal: Ability to remain free from injury will improve Outcome: Progressing   Problem: Skin Integrity: Goal: Risk for impaired skin integrity will decrease Outcome: Progressing

## 2019-12-26 NOTE — Progress Notes (Signed)
Patient ID: Veronica Ray, female   DOB: August 07, 1955, 64 y.o.   MRN: [id number] I have reviewed the patient's imaging studies including plain films and a CT scan.  The pelvic fractures are stable fractures that are usually treated nonoperative.  The standard of care for treatment of these types of fractures is weightbearing as tolerated.  Obviously the patient will be experiencing some pain, but there is no surgery that is indicated for fractures such as these in her pelvis, and attempts for full weightbearing as tolerated are the recommendation.  The left proximal humerus fracture can be treated with a sling and ice for swelling.  The patient should limit abduction and external rotation of her left shoulder until further notice.
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
## 2019-12-26 NOTE — ED Notes (Signed)
[facility] picking up patient and taking her to [facility].
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
## 2019-12-26 NOTE — ED Notes (Signed)
Attempted to call report again, and RN is still receiving report from day shift. Secretary reports RN will call back when she is done.

## 2019-12-27 ENCOUNTER — Observation Stay (HOSPITAL_COMMUNITY): Payer: BC Managed Care – PPO

## 2019-12-27 LAB — CBC
HCT: 35.1 % — ABNORMAL LOW (ref 36.0–46.0)
Hemoglobin: 11.8 g/dL — ABNORMAL LOW (ref 12.0–15.0)
MCH: 30.9 pg (ref 26.0–34.0)
MCHC: 33.6 g/dL (ref 30.0–36.0)
MCV: 91.9 fL (ref 80.0–100.0)
Platelets: 136 10*3/uL — ABNORMAL LOW (ref 150–400)
RBC: 3.82 MIL/uL — ABNORMAL LOW (ref 3.87–5.11)
RDW: 12.2 % (ref 11.5–15.5)
WBC: 6.5 10*3/uL (ref 4.0–10.5)
nRBC: 0 % (ref 0.0–0.2)

## 2019-12-27 LAB — BASIC METABOLIC PANEL
Anion gap: 7 (ref 5–15)
BUN: 10 mg/dL (ref 8–23)
CO2: 21 mmol/L — ABNORMAL LOW (ref 22–32)
Calcium: 8.6 mg/dL — ABNORMAL LOW (ref 8.9–10.3)
Chloride: 109 mmol/L (ref 98–111)
Creatinine, Ser: 0.54 mg/dL (ref 0.44–1.00)
GFR calc Af Amer: >60 mL/min (ref >60)
GFR calc non Af Amer: >60 mL/min (ref >60)
Glucose, Bld: 110 mg/dL — ABNORMAL HIGH (ref 70–99)
Potassium: 4.1 mmol/L (ref 3.5–5.1)
Sodium: 137 mmol/L (ref 135–145)

## 2019-12-27 LAB — TYPE AND SCREEN
ABO/RH(D): O POS
Antibody Screen: NEGATIVE

## 2019-12-27 MED ORDER — OXYCODONE HCL 5 MG PO TABS: 10.0000 mg | ORAL_TABLET | ORAL | Status: DC | PRN

## 2019-12-27 MED ORDER — OXYCODONE HCL 5 MG PO TABS
5.0000 mg | ORAL_TABLET | ORAL | Status: DC | PRN
  Administered 2019-12-27: 5 mg via ORAL
  Filled 2019-12-27: qty 1

## 2019-12-27 MED ORDER — ENOXAPARIN SODIUM 30 MG/0.3ML ~~LOC~~ SOLN
30.0000 mg | Freq: Two times a day (BID) | SUBCUTANEOUS | Status: DC
  Administered 2019-12-27 – 2019-12-28 (×3): 30 mg via SUBCUTANEOUS
  Filled 2019-12-27 (×3): qty 0.3

## 2019-12-27 NOTE — Discharge Instructions (Addendum)
You may bear weight as tolerated to both of your lower extremities Remain in the sling for left upper extremity, no external rotation and abduction, nonweightbearing to left arm. Ok to come out of the sling for gentle range of motion of the elbow, wrist and hand.  Recommend that you get a squeeze ball at home to help with circulation and edema  How To Use a Sling A sling is a type of hanging bandage. You wear it around your neck to protect an injured arm, shoulder, or other body part. You may need to wear a sling so that your injured body part does not move (is immobilized) while it heals. Keeping the injured part of your body still can lessen pain and speed up healing. Your doctor may suggest that you use a sling if you have:  A broken arm.  A broken collarbone.  A shoulder injury.  Surgery. What are the risks? Wearing a sling is safe. In some cases, wearing a sling the wrong way can:  Make your injury worse.  Cause stiffness or loss of feeling (numbness).  Affect blood flow (circulation) in your arm and hand. This can cause tingling or loss of feeling in your fingers or hands. How to use a sling Follow instructions from your doctor about how and when to wear your sling. Your doctor will show you or tell you:  How to put on the sling.  How to adjust the sling.  When and how often to wear the sling.  How to take off the sling. The way that you use a sling depends on your injury. Follow these instructions (unless your doctor tells you other instructions):  Wear the sling so that your elbow bends to the shape of a capital letter "L" (at a 90-degree angle, also called a right angle).  Make sure the sling supports your wrist and your hand.  Adjust the sling if your fingers or hand start to tingle or lose feeling. Follow these instructions at home:  Try to not move your arm.  Do not twist, lift, or move your arm in a way that could make your injury worse.  Do not lean on your  arm while you have to wear a sling.  Do not lift anything while you have to wear a sling. Contact a doctor if:  You have: ? Bruising, swelling, or pain that gets worse. ? Pain that does not get better with medicine. ? A fever.  Your sling: ? Does not support your arm like it should. ? Gets damaged. Get help right away if:  You lose feeling in your fingers.  Your fingers: ? Are tingling. ? Turn blue. ? Feel cold to the touch.  You cannot control the bleeding from your injury.  You have shortness of breath. Summary  A sling is a type of hanging bandage. You wear it around your neck to protect an injured arm, shoulder, or other body part.  You may need to wear a sling so that your injured body part does not move (is immobilized) while it heals.  The way that you use a sling depends on your injury. Follow instructions from your doctor about how and when to wear your sling.  In general, you should wear the sling so that your elbow bends to the shape of a capital letter "L." This information is not intended to replace advice given to you by your health care provider. Make sure you discuss any questions you have with your health  care provider. Document Revised: 03/20/2017 Document Reviewed: 02/26/2017 Elsevier Patient Education  2020 [employer].
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
## 2019-12-27 NOTE — Plan of Care (Signed)
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
## 2019-12-27 NOTE — Plan of Care (Signed)

## 2019-12-27 NOTE — TOC CAGE-AID Note (Signed)
Transition of Care [facility]) - CAGE-AID Screening   Patient Details  Name: Veronica Ray MRN: [id number] Date of Birth: 1955-07-29  Transition of Care [facility]) CM/SW Contact:    Emeterio Reeve, [place] Phone Number: 12/27/2019, 1:51 PM   Clinical Narrative:  CSW met with pt at bedside. CSW introduced self and explained her role at the hospital.  PT denies alcohol use and substance use. Pt did not need any resources at this time.    CAGE-AID Screening:    Have You Ever Felt You Ought to Cut Down on Your Drinking or Drug Use?: No Have People Annoyed You By Critizing Your Drinking Or Drug Use?: No Have You Felt Bad Or Guilty About Your Drinking Or Drug Use?: No Have You Ever Had a Drink or Used Drugs First Thing In The Morning to Steady Your Nerves or to Get Rid of a Hangover?: No CAGE-AID Score: 0  Substance Abuse Education Offered: Yes    Blima Ledger, [place] Social Worker [phone]
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
## 2019-12-27 NOTE — TOC Initial Note (Signed)
Transition of Care [facility]) - Initial/Assessment Note    Patient Details  Name: Kenetha Cozza MRN: [id number] Date of Birth: 1955/09/04  Transition of Care [facility]) CM/SW Contact:    Emeterio Reeve, [place] Phone Number: 12/27/2019, 12:50 PM  Clinical Narrative:                  CSW met with pt at bedside. CSW introduced self and explained her role at the hospital.  PTs husband was present for conversation. Pt reports PTA she was independent and did not use any DME. Pt lives at home with her husband.   CSW reviewed pt reccs of home with [facility]. Pt and husband stated they understand HHPT is needed. Pt reports she has not had HH in the past and does not have a specific agency in mind. Pt reports that her family will be able to provide 24 hour support between her husband and children.   CSW will continue to follow.   Expected Discharge Plan: [place] Barriers to Discharge: Continued Medical Work up   Patient Goals and CMS Choice Patient states their goals for this hospitalization and ongoing recovery are:: To return home CMS Medicare.gov Compare Post Acute Care list provided to:: Patient Choice offered to / list presented to : Patient  Expected Discharge Plan and Services Expected Discharge Plan: [place]       Living arrangements for the past 2 months: Single Family Home                                      Prior Living Arrangements/Services Living arrangements for the past 2 months: Single Family Home Lives with:: Spouse Patient language and need for interpreter reviewed:: Yes Do you feel safe going back to the place where you live?: Yes      Need for Family Participation in Patient Care: Yes (Comment) Care giver support system in place?: Yes (comment)   Criminal Activity/Legal Involvement Pertinent to Current Situation/Hospitalization: No - Comment as needed  Activities of Daily Living Home Assistive Devices/Equipment: None ADL  Screening (condition at time of admission) Patient's cognitive ability adequate to safely complete daily activities?: Yes Is the patient deaf or have difficulty hearing?: No Does the patient have difficulty seeing, even when wearing glasses/contacts?: No Does the patient have difficulty concentrating, remembering, or making decisions?: No Patient able to express need for assistance with ADLs?: Yes Does the patient have difficulty dressing or bathing?: Yes Independently performs ADLs?: No Does the patient have difficulty walking or climbing stairs?: Yes Weakness of Legs: Both (d/t peliv fracture) Weakness of Arms/Hands: Left  Permission Sought/Granted Permission sought to share information with : Investment banker, corporate granted to share info w AGENCY: HH        Emotional Assessment Appearance:: Appears stated age Attitude/Demeanor/Rapport: Engaged Affect (typically observed): Appropriate Orientation: : Oriented to Self, Oriented to Place, Oriented to  Time, Oriented to Situation Alcohol / Substance Use: Not Applicable Psych Involvement: No (comment)  Admission diagnosis:  Pelvic fracture ([place]) [S32.9XXA] Fall, initial encounter [id number]] Closed bilateral fracture of pubic rami, initial encounter ([place]) [S32.591A, S32.592A] Closed nondisplaced fracture of surgical neck of left humerus, unspecified fracture morphology, initial encounter [X51.700F] Patient Active Problem List   Diagnosis Date Noted  . Pelvic fracture ([place]) 12/26/2019  . Closed nondisplaced fracture of surgical neck of left humerus   .  Fall   . Closed bilateral fracture of pubic rami ([place])   . Contusion of rib on right side 01/18/2019  . Elevated LDL cholesterol level 12/14/2018  . Healthcare maintenance 09/21/2018  . Onychomycosis 09/21/2018  . External hemorrhoid, bleeding 01/12/2018  . Paroxysmal SVT (supraventricular tachycardia) ([place]) 02/24/2013   PCP:  Lorrene Reid,  PA-C Pharmacy:   [facility] [address]), [place] - [address] [place] [address] Phone: [phone] Fax: [phone]  [facility] [place] [place] to Registered [facility] [place] [address] Phone: [phone] Fax: [phone]     Social Determinants of Health (SDOH) Interventions    Readmission Risk Interventions No flowsheet data found.

## 2019-12-28 MED ORDER — OXYCODONE HCL 5 MG PO TABS
5.0000 mg | ORAL_TABLET | Freq: Four times a day (QID) | ORAL | 0 refills | Status: DC | PRN
Start: 2019-12-28 — End: 2020-01-18

## 2019-12-28 MED ORDER — METHOCARBAMOL 500 MG PO TABS: 500.0000 mg | ORAL_TABLET | Freq: Three times a day (TID) | ORAL | 0 refills | Status: DC | PRN

## 2019-12-28 MED ORDER — GABAPENTIN 300 MG PO CAPS: 300.0000 mg | ORAL_CAPSULE | Freq: Three times a day (TID) | ORAL | 0 refills | Status: DC | PRN

## 2019-12-28 MED ORDER — ACETAMINOPHEN 500 MG PO TABS: 1000.0000 mg | ORAL_TABLET | Freq: Three times a day (TID) | ORAL | 0 refills | Status: DC | PRN

## 2019-12-28 MED ORDER — POLYETHYLENE GLYCOL 3350 17 G PO PACK: 17.0000 g | PACK | Freq: Every day | ORAL | 0 refills | Status: DC | PRN

## 2019-12-28 NOTE — Progress Notes (Signed)
Discharge paperwork and instructions given to pt. Pt not in distress and tolerated well. 
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
## 2019-12-28 NOTE — TOC Transition Note (Signed)
Transition of Care [facility]) - CM/SW Discharge Note   Patient Details  Name: Veronica Ray MRN: [id number] Date of Birth: 1955-09-21  Transition of Care [facility]) CM/SW Contact:  Glennon Mac, RN Phone Number: 12/28/2019, 2:28 PM   Clinical Narrative:   Patient medically stable for discharge home today with spouse to provide assistance.  PT/OT recommending home health follow-up, and patient agreeable to services.  Referral to [facility] for continued therapies at home.  Referral to [facility] for recommended DME, to be delivered to bedside prior to discharge.  No other discharge needs identified.    Final next level of care: Home w Home Health Services Barriers to Discharge: Barriers Resolved   Patient Goals and CMS Choice Patient states their goals for this hospitalization and ongoing recovery are:: To return home CMS Medicare.gov Compare Post Acute Care list provided to:: Patient Choice offered to / list presented to : Patient                        Discharge Plan and Services   Discharge Planning Services: CM Consult            DME Arranged: 3-N-1, [place] Mor DME Agency: [facility] Date DME Agency Contacted: 12/28/19 Time DME Agency Contacted: [phone] Representative spoke with at DME Agency: Texted to [facility] HH Arranged: PT, OT HH Agency: [facility] Date [facility] Agency Contacted: 12/28/19 Time HH Agency Contacted: [phone] Representative spoke with at [facility] Agency: Lorenza Chick  Social Determinants of Health (SDOH) Interventions     Readmission Risk Interventions No flowsheet data found.  Quintella Baton, RN, BSN  Trauma/Neuro ICU Case Manager [phone]

## 2019-12-28 NOTE — Plan of Care (Signed)
  Problem: Safety: Goal: Ability to remain free from injury will improve Outcome: Progressing   
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
## 2020-01-02 ENCOUNTER — Telehealth: Payer: Self-pay | Admitting: Orthopaedic Surgery

## 2020-01-02 NOTE — Telephone Encounter (Signed)
Patient called requesting a out of work note. Patient was seen in [facility] ED by Dr. Magnus Ivan. Patient states she requested a doctor's note but note was not in discharge paperwork. 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 Patient phone number is [phone] or [phone].

## 2020-01-02 NOTE — Telephone Encounter (Signed)
Patient aware note has been written for her and it should show up on her MyChart

## 2020-01-02 NOTE — Telephone Encounter (Signed)
Is this ok?
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
## 2020-01-04 ENCOUNTER — Encounter: Payer: Self-pay | Admitting: Family Medicine

## 2020-01-04 ENCOUNTER — Ambulatory Visit: Payer: BC Managed Care – PPO | Admitting: Family Medicine

## 2020-01-04 ENCOUNTER — Ambulatory Visit: Payer: Self-pay

## 2020-01-04 ENCOUNTER — Other Ambulatory Visit: Payer: Self-pay

## 2020-01-04 DIAGNOSIS — S42295A Other nondisplaced fracture of upper end of left humerus, initial encounter for closed fracture: Secondary | ICD-10-CM

## 2020-01-04 DIAGNOSIS — S329XXA Fracture of unspecified parts of lumbosacral spine and pelvis, initial encounter for closed fracture: Secondary | ICD-10-CM | POA: Diagnosis not present

## 2020-01-04 DIAGNOSIS — M25512 Pain in left shoulder: Secondary | ICD-10-CM | POA: Diagnosis not present

## 2020-01-04 MED ORDER — NYSTATIN 100000 UNIT/GM EX POWD: 1.0000 "application " | Freq: Three times a day (TID) | CUTANEOUS | 3 refills | Status: DC

## 2020-01-04 MED ORDER — VITAMIN D-3 125 MCG (5000 UT) PO TABS: 1.0000 | ORAL_TABLET | Freq: Every day | ORAL | 3 refills | Status: DC

## 2020-01-04 MED ORDER — METHOCARBAMOL 500 MG PO TABS: 500.0000 mg | ORAL_TABLET | Freq: Three times a day (TID) | ORAL | 3 refills | Status: DC | PRN

## 2020-01-04 MED ORDER — VITAMIN K2 100 MCG PO TABS: 100.0000 ug | ORAL_TABLET | Freq: Every day | ORAL | 3 refills | Status: DC

## 2020-01-04 NOTE — Progress Notes (Signed)
I saw and examined the patient with Dr. Marga Hoots and agree with assessment and plan as outlined.    Left humeral neck fracture, nearly anatomically aligned.  Ok to do pendulum exercises, work on elbow ROM.  Doing home PT already.  Recheck with x-rays in 2 weeks.  Anticipate 6-12 weeks healing time.  Pubic ramus fracture is anatomically aligned.  WBAT, gentle home exercises.  Will follow clinically.  Works as a Airline pilot at [employer].  Probably won't be able to drive herself for a few more weeks.
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
## 2020-01-18 ENCOUNTER — Ambulatory Visit: Payer: BC Managed Care – PPO | Admitting: Family Medicine

## 2020-01-18 ENCOUNTER — Ambulatory Visit: Payer: Self-pay

## 2020-01-18 ENCOUNTER — Other Ambulatory Visit: Payer: Self-pay

## 2020-01-18 ENCOUNTER — Encounter: Payer: Self-pay | Admitting: Family Medicine

## 2020-01-18 DIAGNOSIS — S329XXD Fracture of unspecified parts of lumbosacral spine and pelvis, subsequent encounter for fracture with routine healing: Secondary | ICD-10-CM

## 2020-01-18 DIAGNOSIS — M25522 Pain in left elbow: Secondary | ICD-10-CM | POA: Diagnosis not present

## 2020-01-18 DIAGNOSIS — S42295D Other nondisplaced fracture of upper end of left humerus, subsequent encounter for fracture with routine healing: Secondary | ICD-10-CM | POA: Diagnosis not present

## 2020-01-18 MED ORDER — METHOCARBAMOL 500 MG PO TABS: 500.0000 mg | ORAL_TABLET | Freq: Three times a day (TID) | ORAL | 3 refills | Status: DC | PRN

## 2020-01-18 NOTE — Progress Notes (Signed)
I saw and examined the patient with Dr. Marga Hoots and agree with assessment and plan as outlined.    Clinically healing left prox humerus fracture and pelvic fracture.    Will advance PROM of shoulder and elbow as tolerated.  Ok to [facility] on legs without cane when pain permits.    Repeat left shoulder x-rays in 3 weeks and begin AROM at that time if indicated.
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
## 2020-01-27 ENCOUNTER — Telehealth: Payer: Self-pay | Admitting: Family Medicine

## 2020-01-27 ENCOUNTER — Encounter: Payer: Self-pay | Admitting: Family Medicine

## 2020-01-27 NOTE — Telephone Encounter (Signed)
Letter written

## 2020-01-27 NOTE — Telephone Encounter (Signed)
I called and advised the patient the letter is available to her through MyChart now.

## 2020-01-27 NOTE — Telephone Encounter (Signed)
Please advise 

## 2020-01-27 NOTE — Telephone Encounter (Signed)
Pt called wanting to get a note for work listing restrictions like not lifting anything heavy and not being on feet for long periods of time? Pt states it needs to be detailed and can be uploaded to mychart when completed   [phone]

## 2020-02-10 ENCOUNTER — Ambulatory Visit: Payer: Self-pay

## 2020-02-10 ENCOUNTER — Ambulatory Visit: Payer: BC Managed Care – PPO | Admitting: Family Medicine

## 2020-02-10 ENCOUNTER — Encounter: Payer: Self-pay | Admitting: Family Medicine

## 2020-02-10 ENCOUNTER — Other Ambulatory Visit: Payer: Self-pay

## 2020-02-10 DIAGNOSIS — S42295D Other nondisplaced fracture of upper end of left humerus, subsequent encounter for fracture with routine healing: Secondary | ICD-10-CM | POA: Diagnosis not present

## 2020-02-10 DIAGNOSIS — S329XXD Fracture of unspecified parts of lumbosacral spine and pelvis, subsequent encounter for fracture with routine healing: Secondary | ICD-10-CM

## 2020-02-10 MED ORDER — DICLOFENAC SODIUM 1 % EX GEL: 4.0000 g | Freq: Four times a day (QID) | CUTANEOUS | 6 refills | Status: DC | PRN
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
## 2020-02-10 NOTE — Addendum Note (Signed)
Addended by: Lillia Carmel on: 02/10/2020 08:49 AM   Modules accepted: Orders

## 2020-02-15 ENCOUNTER — Telehealth: Payer: Self-pay | Admitting: Physician Assistant

## 2020-02-15 NOTE — Telephone Encounter (Signed)
If you cannot find original paperwork in the scan folder, please call [facility] and request they resent forms for completion.  Tiajuana Amass, CMA

## 2020-02-15 NOTE — Telephone Encounter (Signed)
Patient's home health certification was faxed over and half is cut off, I am not aware of this. Order number is [id number]. Thanks

## 2020-02-17 ENCOUNTER — Telehealth: Payer: Self-pay

## 2020-02-17 NOTE — Telephone Encounter (Signed)
I called and advised the patient. 

## 2020-02-17 NOTE — Telephone Encounter (Signed)
Patient called she is wondering if it is okay for her to get her covid booster shot.call back:[phone]

## 2020-02-17 NOTE — Telephone Encounter (Signed)
Please advise 

## 2020-02-17 NOTE — Telephone Encounter (Signed)
It would be fine from an orthopedic standpoint.

## 2020-02-24 ENCOUNTER — Encounter: Payer: Self-pay | Admitting: Family Medicine

## 2020-03-02 ENCOUNTER — Encounter: Payer: Self-pay | Admitting: Family Medicine

## 2020-03-02 ENCOUNTER — Other Ambulatory Visit: Payer: Self-pay

## 2020-03-02 ENCOUNTER — Ambulatory Visit (INDEPENDENT_AMBULATORY_CARE_PROVIDER_SITE_OTHER): Payer: BC Managed Care – PPO

## 2020-03-02 ENCOUNTER — Ambulatory Visit: Payer: BC Managed Care – PPO | Admitting: Family Medicine

## 2020-03-02 DIAGNOSIS — S42295A Other nondisplaced fracture of upper end of left humerus, initial encounter for closed fracture: Secondary | ICD-10-CM

## 2020-03-02 DIAGNOSIS — S42295D Other nondisplaced fracture of upper end of left humerus, subsequent encounter for fracture with routine healing: Secondary | ICD-10-CM | POA: Diagnosis not present
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
## 2020-03-23 ENCOUNTER — Ambulatory Visit (INDEPENDENT_AMBULATORY_CARE_PROVIDER_SITE_OTHER): Payer: BC Managed Care – PPO

## 2020-03-23 ENCOUNTER — Other Ambulatory Visit: Payer: Self-pay

## 2020-03-23 DIAGNOSIS — Z23 Encounter for immunization: Secondary | ICD-10-CM | POA: Diagnosis not present

## 2020-03-23 NOTE — Progress Notes (Signed)
Patient tolerated vaccine well. VIS given. AS, CMA 

## 2020-03-30 ENCOUNTER — Ambulatory Visit (INDEPENDENT_AMBULATORY_CARE_PROVIDER_SITE_OTHER): Payer: BC Managed Care – PPO

## 2020-03-30 ENCOUNTER — Encounter: Payer: Self-pay | Admitting: Family Medicine

## 2020-03-30 ENCOUNTER — Ambulatory Visit: Payer: BC Managed Care – PPO | Admitting: Family Medicine

## 2020-03-30 ENCOUNTER — Other Ambulatory Visit: Payer: Self-pay

## 2020-03-30 DIAGNOSIS — S42295D Other nondisplaced fracture of upper end of left humerus, subsequent encounter for fracture with routine healing: Secondary | ICD-10-CM

## 2020-03-30 DIAGNOSIS — S329XXD Fracture of unspecified parts of lumbosacral spine and pelvis, subsequent encounter for fracture with routine healing: Secondary | ICD-10-CM

## 2020-03-30 NOTE — Progress Notes (Signed)
Office Visit Note   Patient: Veronica Ray           Date of Birth: [birth year]           MRN: [id number] Visit Date: 03/30/2020 Requested by: Mayer Masker, PA-C [address] [place],  [place] [address] PCP: Mayer Masker, PA-C  Subjective: Chief Complaint  Patient presents with  . Left Shoulder - Follow-up, Fracture    No pain during the day. Sore after exercise. Still lacking some ROM. Has a different physical therapist now -- her sessions have been "less aggressive." DOI 12/26/19    HPI: She about 3 months status post fall resulting in left shoulder proximal humerus fracture and pelvis fracture.  From a pelvis fracture standpoint, she is doing very well, hardly ever notices it except when doing certain twisting motions.  From a left shoulder standpoint she is still doing physical therapy but feels like she can do pretty much everything at home now.  She still struggles with her range of motion but her pain is a lot better, it does not keep her awake at night.  In about a month she needs to be doing regular duty at work.  This will involve using a saw and other tools.                ROS:   All other systems were reviewed and are negative.  Objective: Vital Signs: There were no vitals taken for this visit.  Physical Exam:  General:  Alert and oriented, in no acute distress. Pulm:  Breathing unlabored. Psy:  Normal mood, congruent affect.  Left shoulder: She has abduction of 60 degrees this morning, external rotation of about 30 and internal rotation of about 45.  This is passive and active.  She has some pain at the extremes.  There is weakness with external rotation against resistance, but overall she does not have any significant pain with testing of the rotator cuff.    Imaging: XR Shoulder Left  Result Date: 03/30/2020 X-rays of the left shoulder now shows some callus formation at the fracture site with no further displacement or  angulation.   Assessment & Plan: 1.  Clinically healing 3 months status post left proximal humerus fracture, with adhesive capsulitis. -She will do therapy at home, increase intensity of exercises as pain permits.  Anticipate regaining much of her range of motion over the next 3 months.  If she fails to progress, we will see her back and consider doing a glenohumeral injection.  If she starts having more pain, we will order MRI scan.  2.  Clinically healing 59-month status post pelvis fracture.     Procedures: No procedures performed        PMFS History: Patient Active Problem List   Diagnosis Date Noted  . Pelvic fracture (HCC) 12/26/2019  . Closed nondisplaced fracture of surgical neck of left humerus   . Fall   . Closed bilateral fracture of pubic rami (HCC)   . Contusion of rib on right side 01/18/2019  . Elevated LDL cholesterol level 12/14/2018  . Healthcare maintenance 09/21/2018  . Onychomycosis 09/21/2018  . External hemorrhoid, bleeding 01/12/2018  . Paroxysmal SVT (supraventricular tachycardia) (HCC) 02/24/2013   Past Medical History:  Diagnosis Date  . Allergy   . SVT (supraventricular tachycardia) (HCC)     Family History  Problem Relation Age of Onset  . Cancer Paternal Grandmother   . Cancer Paternal Grandfather     Past Surgical History:  Procedure Laterality Date  . CESAREAN SECTION     Social History   Occupational History  . Not on file  Tobacco Use  . Smoking status: Never Smoker  . Smokeless tobacco: Never Used  Vaping Use  . Vaping Use: Never used  Substance and Sexual Activity  . Alcohol use: No  . Drug use: No  . Sexual activity: Not on file

## 2020-04-27 ENCOUNTER — Encounter: Payer: Self-pay | Admitting: Physician Assistant

## 2020-04-27 NOTE — Telephone Encounter (Signed)
Spoke with patient and advised we were not ordering labs for patient. Advised patient she could go to [email or web address] to schedule covid vaccine but that we suggest her being evaluated to determine if at this point she needs an antibiotic. Patient verbalized understanding. AS, CMA

## 2020-05-01 ENCOUNTER — Ambulatory Visit: Payer: Self-pay

## 2020-05-01 ENCOUNTER — Other Ambulatory Visit: Payer: Self-pay

## 2020-05-01 ENCOUNTER — Ambulatory Visit
Admission: EM | Admit: 2020-05-01 | Discharge: 2020-05-01 | Disposition: A | Discharge status: Home / Self Care | Payer: BC Managed Care – PPO | Attending: Physician Assistant | Admitting: Physician Assistant

## 2020-05-01 ENCOUNTER — Encounter: Payer: Self-pay | Admitting: Emergency Medicine

## 2020-05-01 DIAGNOSIS — J069 Acute upper respiratory infection, unspecified: Secondary | ICD-10-CM | POA: Diagnosis not present

## 2020-05-01 NOTE — ED Triage Notes (Signed)
Pt here for nasal congestion x 3 weeks; pt sts negative covid test

## 2020-05-01 NOTE — Discharge Instructions (Signed)
Return if any problems. Covid is pending 

## 2020-05-01 NOTE — ED Provider Notes (Signed)
[name]    CSN: [id number] Arrival date & time: 05/01/20  1121      History   Chief Complaint Chief Complaint  Patient presents with  . Appointment    1100  . Nasal Congestion    HPI Veronica Ray is a 65 y.o. female.   Pt complains of nasal congestion for 3 weeks    The history is provided by the patient. No language interpreter was used.  Cough Cough characteristics:  Non-productive Sputum characteristics:  Nondescript Severity:  Moderate Onset quality:  Gradual Timing:  Constant Progression:  Worsening Chronicity:  New Smoker: no   Relieved by:  Nothing Worsened by:  Nothing Ineffective treatments:  None tried   Past Medical History:  Diagnosis Date  . Allergy   . SVT (supraventricular tachycardia) [facility])     Patient Active Problem List   Diagnosis Date Noted  . Pelvic fracture (HCC) 12/26/2019  . Closed nondisplaced fracture of surgical neck of left humerus   . Fall   . Closed bilateral fracture of pubic rami (HCC)   . Contusion of rib on right side 01/18/2019  . Elevated LDL cholesterol level 12/14/2018  . Healthcare maintenance 09/21/2018  . Onychomycosis 09/21/2018  . External hemorrhoid, bleeding 01/12/2018  . Paroxysmal SVT (supraventricular tachycardia) (HCC) 02/24/2013    Past Surgical History:  Procedure Laterality Date  . CESAREAN SECTION      OB History   No obstetric history on file.      Home Medications    Prior to Admission medications   Medication Sig Start Date End Date Taking? Authorizing Provider  acetaminophen (TYLENOL) 500 MG tablet Take 2 tablets (1,000 mg total) by mouth every 8 (eight) hours as needed for mild pain. Patient not taking: Reported on 03/30/2020 12/28/19   Maczis, Elmer Sow, PA-C  Ascorbic Acid (VITAMIN C) 1000 MG tablet Take 1,000 mg by mouth daily.    [provider]  b complex vitamins tablet Take 1 tablet by mouth daily.    [provider]  Cholecalciferol (VITAMIN  D-3) 125 MCG (5000 UT) TABS Take 1 tablet by mouth daily. 01/04/20   Hilts, Casimiro Needle, MD  ciclopirox (LOPROX) 0.77 % cream Apply topically 2 (two) times daily. 11/24/19   Mayer Masker, PA-C  diclofenac Sodium (VOLTAREN) 1 % GEL Apply 4 g topically 4 (four) times daily as needed. 02/10/20   Hilts, Casimiro Needle, MD  Menatetrenone (VITAMIN K2) 100 MCG TABS Take 100 mcg by mouth daily. 01/04/20   Hilts, Casimiro Needle, MD  methocarbamol (ROBAXIN) 500 MG tablet Take 1 tablet (500 mg total) by mouth every 8 (eight) hours as needed for muscle spasms. Patient not taking: Reported on 03/30/2020 01/18/20   Hilts, Casimiro Needle, MD  metoprolol succinate (TOPROL-XL) 25 MG 24 hr tablet Take 1 tablet (25 mg total) by mouth daily. 11/24/19   Mayer Masker, PA-C  Multiple Vitamins-Minerals (MULTIVITAMIN WITH MINERALS) tablet Take 0.5 tablets by mouth daily.     [provider]  nystatin (MYCOSTATIN/NYSTOP) powder Apply 1 application topically 3 (three) times daily. 01/04/20   Hilts, Casimiro Needle, MD  Omega-3 Fatty Acids (FISH OIL) 1000 MG CAPS  01/14/20   [provider]  Pedialyte (PEDIALYTE) SOLN Take 240 mLs by mouth daily as needed (rehydration and Zinc).    [provider]  VITAMIN D PO Take by mouth.    [provider]    Family History Family History  Problem Relation Age of Onset  . Cancer Paternal Grandmother   . Cancer  Paternal Grandfather     Social History Social History   Tobacco Use  . Smoking status: Never Smoker  . Smokeless tobacco: Never Used  Vaping Use  . Vaping Use: Never used  Substance Use Topics  . Alcohol use: No  . Drug use: No     Allergies   Patient has no known allergies.   Review of Systems Review of Systems  Respiratory: Positive for cough.   All other systems reviewed and are negative.    Physical Exam Triage Vital Signs ED Triage Vitals  Enc Vitals Group     BP 05/01/20 1143 (!) 156/103     Pulse Rate 05/01/20 1143 99     Resp 05/01/20 1143  18     Temp 05/01/20 1143 98.1 F (36.7 C)     Temp Source 05/01/20 1143 Oral     SpO2 05/01/20 1143 97 %     Weight --      Height --      Head Circumference --      Peak Flow --      Pain Score 05/01/20 1144 0     Pain Loc --      Pain Edu? --      Excl. in GC? --    No data found.  Updated Vital Signs BP (!) 156/103 (BP Location: Left Arm)   Pulse 99   Temp 98.1 F (36.7 C) (Oral)   Resp 18   SpO2 97%   Visual Acuity Right Eye Distance:   Left Eye Distance:   Bilateral Distance:    Right Eye Near:   Left Eye Near:    Bilateral Near:     Physical Exam Vitals and nursing note reviewed.  Constitutional:      Appearance: She is well-developed and well-nourished.  HENT:     Head: Normocephalic.  Eyes:     Extraocular Movements: EOM normal.  Cardiovascular:     Rate and Rhythm: Normal rate and regular rhythm.  Pulmonary:     Effort: Pulmonary effort is normal.  Abdominal:     General: There is no distension.  Musculoskeletal:        General: Normal range of motion.     Cervical back: Normal range of motion.  Skin:    General: Skin is warm.  Neurological:     General: No focal deficit present.     Mental Status: She is alert and oriented to person, place, and time.  Psychiatric:        Mood and Affect: Mood and affect and mood normal.      UC Treatments / Results  Labs (all labs ordered are listed, but only abnormal results are displayed) Labs Reviewed  NOVEL CORONAVIRUS, NAA    EKG   Radiology No results found.  Procedures Procedures (including critical care time)  Medications Ordered in UC Medications - No data to display  Initial Impression / Assessment and Plan / UC Course  I have reviewed the triage vital signs and the nursing notes.  Pertinent labs & imaging results that were available during my care of the patient were reviewed by me and considered in my medical decision making (see chart for details).     MDM:  Pt counseled on  possible covid  Pt seems well.  I advised continue symptomatic care covid pending Final Clinical Impressions(s) / UC Diagnoses   Final diagnoses:  Acute upper respiratory infection     Discharge Instructions     Return  if any problems.  Covid is pending    ED Prescriptions    None    An After Visit Summary was printed and given to the patient.  PDMP not reviewed this encounter.   Elson Areas, [place] 05/01/20 1611

## 2020-05-05 LAB — NOVEL CORONAVIRUS, NAA: SARS-CoV-2, NAA: NOT DETECTED

## 2020-06-03 ENCOUNTER — Encounter: Payer: Self-pay | Admitting: Physician Assistant

## 2020-06-03 DIAGNOSIS — I471 Supraventricular tachycardia: Secondary | ICD-10-CM

## 2020-06-04 MED ORDER — METOPROLOL SUCCINATE ER 25 MG PO TB24: 25.0000 mg | ORAL_TABLET | Freq: Every day | ORAL | 0 refills | Status: DC

## 2020-07-22 ENCOUNTER — Other Ambulatory Visit: Payer: Self-pay | Admitting: Physician Assistant

## 2020-07-22 DIAGNOSIS — I471 Supraventricular tachycardia: Secondary | ICD-10-CM

## 2020-07-23 ENCOUNTER — Telehealth: Payer: Self-pay | Admitting: Physician Assistant

## 2020-07-23 NOTE — Telephone Encounter (Signed)
Please contact patient to schedule apt per last AVS for further medication refills. AS, CMA 

## 2020-07-23 NOTE — Telephone Encounter (Signed)
Patient scheduled per last AVS but requested May appt due to April being busy for patient.

## 2020-08-16 ENCOUNTER — Other Ambulatory Visit: Payer: Self-pay | Admitting: Physician Assistant

## 2020-08-16 DIAGNOSIS — I471 Supraventricular tachycardia: Secondary | ICD-10-CM

## 2020-08-20 ENCOUNTER — Other Ambulatory Visit: Payer: Self-pay

## 2020-08-20 ENCOUNTER — Encounter: Payer: Self-pay | Admitting: Physician Assistant

## 2020-08-20 ENCOUNTER — Ambulatory Visit: Payer: BC Managed Care – PPO | Admitting: Physician Assistant

## 2020-08-20 VITALS — BP 106/66 | HR 76 | Temp 98.4°F | Ht 64.0 in | Wt 143.3 lb

## 2020-08-20 DIAGNOSIS — I471 Supraventricular tachycardia: Secondary | ICD-10-CM

## 2020-08-20 DIAGNOSIS — S42215D Unspecified nondisplaced fracture of surgical neck of left humerus, subsequent encounter for fracture with routine healing: Secondary | ICD-10-CM

## 2020-08-20 DIAGNOSIS — E7800 Pure hypercholesterolemia, unspecified: Secondary | ICD-10-CM | POA: Diagnosis not present

## 2020-08-20 MED ORDER — METOPROLOL SUCCINATE ER 25 MG PO TB24: 1.0000 | ORAL_TABLET | Freq: Every day | ORAL | 1 refills | Status: DC

## 2020-08-20 NOTE — Patient Instructions (Signed)
Supraventricular Tachycardia, Adult Supraventricular tachycardia (SVT) is a kind of abnormal heartbeat. It makes your heart beat very fast. This may last for a short time and then return to normal, or it may last longer. A normal resting heartbeat is 60-100 times a minute. This condition can make your heart beat more than 150 times a minute. Times of having a fast heartbeat (episodes) can be scary, but they are usually not dangerous. In some cases, they may lead to heart failure if they:  Happen many times a day.  Last longer than a few seconds. What are the causes? This condition happens when electrical signals are sent out from areas of the heart that do not normally send signals for the heartbeat.   What increases the risk? You are more likely to develop this condition if you are:  Middle aged or younger.  Female. The following factors may also make you more likely to develop this condition:  Stress.  Feeling worried or nervous (anxiety).  Tiredness.  Smoking.  Stimulant drugs, such as cocaine and methamphetamine.  Alcohol.  Caffeine.  Pregnancy.  Having certain medical conditions. What are the signs or symptoms?  A pounding heart.  A feeling that your heart is skipping beats (palpitations).  Weakness.  Trouble getting enough air.  Pain or tightness in your chest.  Dizziness or feeling like you are going to pass out (faint).  Feeling worried or nervous.  Sweating.  Feeling like you may vomit (nausea).  Passing out.  Tiredness. Sometimes, there are no symptoms. How is this treated? Treatment may include:  Vagal nerve stimulation. Ways to do this include: ? Holding your breath and pushing, as though you are pooping (having a bowel movement). ? Massaging an area on one side of your neck. Do not try this yourself. Only a doctor should do this. If done the wrong way, it can lead to a stroke. ? Bending forward with your head between your legs. ? Coughing  while bending forward with your head between your legs. ? Putting an ice-cold, wet towel on your face.  Medicines that prevent attacks.  Medicine to stop an attack given through an IV tube at the hospital.  A small electric shock (cardioversion) that stops an attack.  A procedure to get rid of cells in the area that is causing the fast heartbeats (radiofrequency ablation). If you do not have symptoms, you may not need treatment. Follow these instructions at home: Stress  Avoid things that make you feel stressed.  To deal with stress, try: ? Doing yoga or meditation. ? Being out in nature. ? Listening to relaxing music. ? Doing deep breathing. ? Taking steps to be healthy, such as getting lots of sleep, exercising, and eating a balanced diet. ? Talking with a mental health doctor. Lifestyle  Try to get at least 7 hours of sleep each night.  Do not smoke or use any products that contain nicotine or tobacco. If you need help quitting, ask your doctor.  Do not drink alcohol if it gives you a fast heartbeat.  If alcohol does not seem to give you a fast heartbeat, limit your alcohol use. If you drink alcohol: ? Limit how much you have to:  0-1 drink a day for women who are not pregnant.  0-2 drinks a day for men. ? Know how much alcohol is in your drink. In the [place] one drink equals one 12 oz bottle of beer (355 mL), one 5 oz glass of wine (148   mL), or one 1 oz glass of hard liquor (44 mL).  Be aware of how caffeine affects you. ? If caffeine gives you a fast heartbeat, do not eat, drink, or use anything with caffeine in it. ? If caffeine does not seem to give you a fast heartbeat, limit how much caffeine you eat, drink, or use.  Do not use stimulant drugs. If you need help quitting, ask your doctor.   General instructions  Stay at a healthy weight.  Exercise regularly. Ask your doctor about good activities for you. Try one or a mixture of these: ? 150 minutes a week of  gentle exercise, like walking or yoga. ? 75 minutes a week of exercise that is very active, like running or swimming.  Do vagus nerve treatments to slow down your heartbeat as told by your doctor.  Take over-the-counter and prescription medicines only as told by your doctor.  Keep all follow-up visits. Contact a doctor if:  You have a fast heartbeat more often.  Times of having a fast heartbeat last longer than before.  Home treatments to slow down your heartbeat do not help.  You have new symptoms. Get help right away if:  You have chest pain.  Your symptoms get worse.  You have trouble breathing.  Your heart beats very fast for more than 20 minutes.  You pass out. These symptoms may be an emergency. Get medical help right away. Call your local emergency services (911 in the [place]  Do not wait to see if the symptoms will go away.  Do not drive yourself to the hospital. Summary  SVT is a type of abnormal heartbeat.  This condition can make your heart beat more than 150 times a minute.  If you do not have symptoms, you may not need treatment. This information is not intended to replace advice given to you by your health care provider. Make sure you discuss any questions you have with your health care provider. Document Revised: 11/19/2019 Document Reviewed: 11/19/2019 Elsevier Patient Education  2021 Elsevier Inc.  

## 2020-08-20 NOTE — Assessment & Plan Note (Signed)
-  Stable, continue current medication regimen. Provided refill. -BP and pulse wnl's. -Will continue to monitor.

## 2020-08-20 NOTE — Progress Notes (Signed)
Established Patient Office Visit  Subjective:  Patient ID: Veronica Ray, female    DOB: 24-Oct-1955  Age: 65 y.o. MRN: [id number]  CC:  Chief Complaint  Patient presents with  . Hyperlipidemia    HPI Merial Moritz presents for chronic follow up on elevated LDL and SVT. Patient sustained a fracture of left humerus 12/2019 and reports has completed treatment and physical therapy, however, continues to have limitations with overhead reaching and plans to reach out to orthopedics.   SVT: Patient denies palpitations, chest discomfort, or dizziness. Reports medication compliance. States has occasional palpitations when feeling anxious which usually are brief and resolve.   Elevated LDL: Patient reports has changed to planned based diet. Reduced red meat.      Past Medical History:  Diagnosis Date  . Allergy   . SVT (supraventricular tachycardia) (HCC)     Past Surgical History:  Procedure Laterality Date  . CESAREAN SECTION      Family History  Problem Relation Age of Onset  . Cancer Paternal Grandmother   . Cancer Paternal Grandfather     Social History   Socioeconomic History  . Marital status: Married    Spouse name: Not on file  . Number of children: Not on file  . Years of education: Not on file  . Highest education level: Not on file  Occupational History  . Not on file  Tobacco Use  . Smoking status: Never Smoker  . Smokeless tobacco: Never Used  Vaping Use  . Vaping Use: Never used  Substance and Sexual Activity  . Alcohol use: No  . Drug use: No  . Sexual activity: Not on file  Other Topics Concern  . Not on file  Social History Narrative  . Not on file   Social Determinants of Health   Financial Resource Strain: Not on file  Food Insecurity: Not on file  Transportation Needs: Not on file  Physical Activity: Not on file  Stress: Not on file  Social Connections: Not on file  Intimate Partner Violence: Not on file    Outpatient Medications  Prior to Visit  Medication Sig Dispense Refill  . metoprolol succinate (TOPROL-XL) 25 MG 24 hr tablet Take 1 tablet (25 mg total) by mouth daily. **NEEDS APT FOR REFILLS** 30 tablet 0  . Cholecalciferol (VITAMIN D-3) 125 MCG (5000 UT) TABS Take 1 tablet by mouth daily. (Patient not taking: Reported on 08/20/2020) 90 tablet 3  . ciclopirox (LOPROX) 0.77 % cream Apply topically 2 (two) times daily. (Patient not taking: Reported on 08/20/2020) 30 g 0  . Multiple Vitamins-Minerals (MULTIVITAMIN WITH MINERALS) tablet Take 0.5 tablets by mouth daily.  (Patient not taking: Reported on 08/20/2020)    . acetaminophen (TYLENOL) 500 MG tablet Take 2 tablets (1,000 mg total) by mouth every 8 (eight) hours as needed for mild pain. (Patient not taking: No sig reported) 30 tablet 0  . Ascorbic Acid (VITAMIN C) 1000 MG tablet Take 1,000 mg by mouth daily. (Patient not taking: Reported on 08/20/2020)    . b complex vitamins tablet Take 1 tablet by mouth daily. (Patient not taking: Reported on 08/20/2020)    . diclofenac Sodium (VOLTAREN) 1 % GEL Apply 4 g topically 4 (four) times daily as needed. 500 g 6  . Menatetrenone (VITAMIN K2) 100 MCG TABS Take 100 mcg by mouth daily. (Patient not taking: Reported on 08/20/2020) 90 tablet 3  . methocarbamol (ROBAXIN) 500 MG tablet Take 1 tablet (500 mg total) by mouth every 8 (  eight) hours as needed for muscle spasms. 45 tablet 3  . nystatin (MYCOSTATIN/NYSTOP) powder Apply 1 application topically 3 (three) times daily. 15 g 3  . Omega-3 Fatty Acids (FISH OIL) 1000 MG CAPS  (Patient not taking: Reported on 08/20/2020)    . Pedialyte (PEDIALYTE) SOLN Take 240 mLs by mouth daily as needed (rehydration and Zinc). (Patient not taking: Reported on 08/20/2020)    . VITAMIN D PO Take by mouth. (Patient not taking: Reported on 08/20/2020)     No facility-administered medications prior to visit.    No Known Allergies  ROS Review of Systems A fourteen system review of systems was performed and  found to be positive as per HPI.   Objective:    Physical Exam General:  Well Developed, well nourished, in no acute distress  Neuro:  Alert and oriented,  extra-ocular muscles intact  HEENT:  Normocephalic, atraumatic, neck supple  Skin:  no gross rash, warm, pink. Cardiac:  RRR, S1 S2 Respiratory:  ECTA B/L, Not using accessory muscles, speaking in full sentences- unlabored. Vascular:  Ext warm, no cyanosis apprec.; cap RF less 2 sec. Psych:  No HI/SI, judgement and insight good, Euthymic mood. Full Affect.  BP 106/66   Pulse 76   Temp 98.4 F (36.9 C)   Ht 5\' 4"  (1.626 m)   Wt 143 lb 4.8 oz (65 kg)   SpO2 98%   BMI 24.60 kg/m  Wt Readings from Last 3 Encounters:  08/20/20 143 lb 4.8 oz (65 kg)  12/26/19 130 lb (59 kg)  11/24/19 135 lb 12.8 oz (61.6 kg)     Health Maintenance Due  Topic Date Due  . Hepatitis C Screening  Never done  . COLONOSCOPY (Pts 45-39yrs Insurance coverage will need to be confirmed)  Never done  . DEXA SCAN  Never done  . PNA vac Low Risk Adult (1 of 2 - PCV13) Never done    There are no preventive care reminders to display for this patient.  Lab Results  Component Value Date   TSH 0.274 (L) 11/17/2019   Lab Results  Component Value Date   WBC 6.5 12/27/2019   HGB 11.8 (L) 12/27/2019   HCT 35.1 (L) 12/27/2019   MCV 91.9 12/27/2019   PLT 136 (L) 12/27/2019   Lab Results  Component Value Date   NA 137 12/27/2019   K 4.1 12/27/2019   CO2 21 (L) 12/27/2019   GLUCOSE 110 (H) 12/27/2019   BUN 10 12/27/2019   CREATININE 0.54 12/27/2019   BILITOT 0.8 11/17/2019   ALKPHOS 89 11/17/2019   AST 24 11/17/2019   ALT 16 11/17/2019   PROT 6.1 11/17/2019   ALBUMIN 4.2 11/17/2019   CALCIUM 8.6 (L) 12/27/2019   ANIONGAP 7 12/27/2019   Lab Results  Component Value Date   CHOL 168 11/17/2019   Lab Results  Component Value Date   HDL 50 11/17/2019   Lab Results  Component Value Date   LDLCALC 98 11/17/2019   Lab Results  Component  Value Date   TRIG 108 11/17/2019   Lab Results  Component Value Date   CHOLHDL 3.4 11/17/2019   Lab Results  Component Value Date   HGBA1C 5.5 11/17/2019      Assessment & Plan:   Problem List Items Addressed This Visit      Cardiovascular and Mediastinum   Paroxysmal SVT (supraventricular tachycardia) (HCC)    -Stable, continue current medication regimen. Provided refill. -BP and pulse wnl's. -Will continue to  monitor.      Relevant Medications   metoprolol succinate (TOPROL-XL) 25 MG 24 hr tablet     Musculoskeletal and Integument   Closed nondisplaced fracture of surgical neck of left humerus     Other   Elevated LDL cholesterol level - Primary    -Last lipid panel wnl's, LDL 98 -Continue heart healthy diet. -Will continue to monitor and repeat lipid panel with CPE.         Closed nondisplaced fracture of surgical neck of left humerus: -Advised to follow up with Orthopedics, Dr. Prince Rome. -Continue HEP.  Meds ordered this encounter  Medications  . metoprolol succinate (TOPROL-XL) 25 MG 24 hr tablet    Sig: Take 1 tablet (25 mg total) by mouth daily.    Dispense:  90 tablet    Refill:  1    Order Specific Question:   Supervising Provider    Answer:   Nani Gasser D [[id number]]    Follow-up: Return for CPE and FBW in 3-4 months .   Note:  This note was prepared with assistance of Dragon voice recognition software. Occasional wrong-word or sound-a-like substitutions may have occurred due to the inherent limitations of voice recognition software.  Mayer Masker, PA-C

## 2020-08-20 NOTE — Assessment & Plan Note (Signed)
-  Last lipid panel wnl's, LDL 98 -Continue heart healthy diet. -Will continue to monitor and repeat lipid panel with CPE.

## 2020-09-17 ENCOUNTER — Encounter: Payer: Self-pay | Admitting: Physician Assistant

## 2020-09-23 ENCOUNTER — Encounter: Payer: Self-pay | Admitting: Physician Assistant

## 2020-09-27 ENCOUNTER — Telehealth: Payer: Self-pay | Admitting: Physician Assistant

## 2020-09-27 NOTE — Telephone Encounter (Signed)
Patient is having light headedness and sinus are off patient states. Ear pressure as well. Please advise, thanks.

## 2020-10-02 ENCOUNTER — Ambulatory Visit: Payer: BC Managed Care – PPO | Admitting: Nurse Practitioner

## 2020-10-02 ENCOUNTER — Encounter: Payer: Self-pay | Admitting: Nurse Practitioner

## 2020-10-02 ENCOUNTER — Other Ambulatory Visit: Payer: Self-pay

## 2020-10-02 VITALS — BP 114/69 | HR 71 | Temp 97.2°F | Ht 64.0 in | Wt 139.6 lb

## 2020-10-02 DIAGNOSIS — R5383 Other fatigue: Secondary | ICD-10-CM

## 2020-10-02 DIAGNOSIS — J301 Allergic rhinitis due to pollen: Secondary | ICD-10-CM

## 2020-10-02 DIAGNOSIS — B351 Tinea unguium: Secondary | ICD-10-CM

## 2020-10-02 DIAGNOSIS — E559 Vitamin D deficiency, unspecified: Secondary | ICD-10-CM

## 2020-10-02 DIAGNOSIS — R42 Dizziness and giddiness: Secondary | ICD-10-CM

## 2020-10-02 MED ORDER — FLUTICASONE PROPIONATE 50 MCG/ACT NA SUSP: 2.0000 | Freq: Every day | NASAL | 6 refills | Status: DC

## 2020-10-02 NOTE — Progress Notes (Signed)
Established Patient Office Visit  Subjective:  Patient ID: Veronica Ray, female    DOB: February 26, 1956  Age: 65 y.o. MRN: [id number]  CC:  Chief Complaint  Patient presents with   Dizziness    HPI Veronica Ray presents for evaluation of dizziness. States this has been going on since the end of may. At that time, she had started spending more time outside. She had been seating more, and was possibly not drinking enough water. She had also made some dietary chagnes. She was eating more fruits and vegetables and eating less meat. States that she had also been having some allergy issues which started about the same time. Had intermittent nasal congestion with sinus pressure and a fullness in her ears. She did not have nausea or vomiting. Denies having a fever at any point during this time period. She states that initially, she was having these dizzy spells at least once a day. Over the past two weeks, they have diminished in frequency. States that, now, they are happening two or three times per week. She states that changing positions does not make the dizziness better or worse. Does not seem to make a difference is she is inside or out.  She states that she has fungal infection of her toenails. Had been treating with prescribed antifungal cream. Had become slack with this last year due to fall which led to broken arm and a few broken toes. She states that recently, this has become worse. She has restarted using the previously prescribed antifungal cream. She is also soaking her foot in warm epsom salt every day. Does not seem to be improving the nail. She states that toenails have been getting thicker.   Past Medical History:  Diagnosis Date   Allergy    SVT (supraventricular tachycardia) (HCC)     Past Surgical History:  Procedure Laterality Date   CESAREAN SECTION      Family History  Problem Relation Age of Onset   Cancer Paternal Grandmother    Cancer Paternal Grandfather     Social  History   Socioeconomic History   Marital status: Married    Spouse name: Not on file   Number of children: Not on file   Years of education: Not on file   Highest education level: Not on file  Occupational History   Not on file  Tobacco Use   Smoking status: Never   Smokeless tobacco: Never  Vaping Use   Vaping Use: Never used  Substance and Sexual Activity   Alcohol use: No   Drug use: No   Sexual activity: Not on file  Other Topics Concern   Not on file  Social History Narrative   Not on file   Social Determinants of Health   Financial Resource Strain: Not on file  Food Insecurity: Not on file  Transportation Needs: Not on file  Physical Activity: Not on file  Stress: Not on file  Social Connections: Not on file  Intimate Partner Violence: Not on file    Outpatient Medications Prior to Visit  Medication Sig Dispense Refill   ciclopirox (LOPROX) 0.77 % cream Apply topically 2 (two) times daily. 30 g 0   metoprolol succinate (TOPROL-XL) 25 MG 24 hr tablet Take 1 tablet (25 mg total) by mouth daily. 90 tablet 1   Multiple Vitamins-Minerals (MULTIVITAMIN WITH MINERALS) tablet Take 0.5 tablets by mouth daily.     Cholecalciferol (VITAMIN D-3) 125 MCG (5000 UT) TABS Take 1 tablet by mouth daily. (Patient  not taking: No sig reported) 90 tablet 3   No facility-administered medications prior to visit.    No Known Allergies  ROS Review of Systems  Constitutional:  Negative for activity change, appetite change, chills and fatigue.  HENT:  Positive for congestion, ear pain, postnasal drip and sinus pressure. Negative for sinus pain.   Eyes: Negative.   Respiratory:  Negative for chest tightness.   Cardiovascular:  Negative for chest pain and palpitations.  Gastrointestinal:  Negative for nausea and vomiting.  Endocrine: Negative.   Genitourinary: Negative.   Musculoskeletal: Negative.  Negative for back pain and myalgias.  Skin:  Negative for rash.       Thickened  toenails due to fungal infection of toenails.   Allergic/Immunologic: Positive for environmental allergies.  Neurological:  Positive for dizziness. Negative for weakness and headaches.  Psychiatric/Behavioral: Negative.  The patient is not nervous/anxious.      Objective:    Physical Exam Vitals and nursing note reviewed.  Constitutional:      Appearance: Normal appearance. She is well-developed.  HENT:     Head: Normocephalic and atraumatic.     Right Ear: Tympanic membrane is erythematous and bulging.     Left Ear: Tympanic membrane is erythematous and bulging.     Nose: Nose normal. No congestion.     Right Sinus: No maxillary sinus tenderness or frontal sinus tenderness.     Left Sinus: No maxillary sinus tenderness or frontal sinus tenderness.     Mouth/Throat:     Mouth: Mucous membranes are moist.     Pharynx: Oropharynx is clear.  Eyes:     Extraocular Movements: Extraocular movements intact.     Conjunctiva/sclera: Conjunctivae normal.     Pupils: Pupils are equal, round, and reactive to light.  Cardiovascular:     Rate and Rhythm: Normal rate and regular rhythm.     Pulses: Normal pulses.     Heart sounds: Normal heart sounds.     Comments: Orthostatic vital signs obtained with and were negative. Pulmonary:     Effort: Pulmonary effort is normal.     Breath sounds: Normal breath sounds.  Abdominal:     Palpations: Abdomen is soft.  Musculoskeletal:        General: Normal range of motion.     Cervical back: Normal range of motion and neck supple.  Lymphadenopathy:     Cervical: No cervical adenopathy.  Skin:    General: Skin is warm and dry.     Capillary Refill: Capillary refill takes less than 2 seconds.  Neurological:     General: No focal deficit present.     Mental Status: She is alert and oriented to person, place, and time.  Psychiatric:        Mood and Affect: Mood normal.        Behavior: Behavior normal.        Thought Content: Thought content  normal.        Judgment: Judgment normal.    Today's Vitals   10/02/20 0912  BP: 114/69  Pulse: 71  Temp: (!) 97.2 F (36.2 C)  SpO2: 99%  Weight: 139 lb 9.6 oz (63.3 kg)  Height: 5\' 4"  (1.626 m)   Body mass index is 23.96 kg/m.  Wt Readings from Last 3 Encounters:  10/02/20 139 lb 9.6 oz (63.3 kg)  08/20/20 143 lb 4.8 oz (65 kg)  12/26/19 130 lb (59 kg)     Health Maintenance Due  Topic Date Due  Hepatitis C Screening  Never done   COLONOSCOPY (Pts 45-38yrs Insurance coverage will need to be confirmed)  Never done   Zoster Vaccines- Shingrix (1 of 2) Never done   DEXA SCAN  Never done   PNA vac Low Risk Adult (1 of 2 - PCV13) Never done   COVID-19 Vaccine (4 - Booster for Moderna series) 06/18/2020    There are no preventive care reminders to display for this patient.  Lab Results  Component Value Date   TSH 0.274 (L) 11/17/2019   Lab Results  Component Value Date   WBC 6.5 12/27/2019   HGB 11.8 (L) 12/27/2019   HCT 35.1 (L) 12/27/2019   MCV 91.9 12/27/2019   PLT 136 (L) 12/27/2019   Lab Results  Component Value Date   NA 137 12/27/2019   K 4.1 12/27/2019   CO2 21 (L) 12/27/2019   GLUCOSE 110 (H) 12/27/2019   BUN 10 12/27/2019   CREATININE 0.54 12/27/2019   BILITOT 0.8 11/17/2019   ALKPHOS 89 11/17/2019   AST 24 11/17/2019   ALT 16 11/17/2019   PROT 6.1 11/17/2019   ALBUMIN 4.2 11/17/2019   CALCIUM 8.6 (L) 12/27/2019   ANIONGAP 7 12/27/2019   Lab Results  Component Value Date   CHOL 168 11/17/2019   Lab Results  Component Value Date   HDL 50 11/17/2019   Lab Results  Component Value Date   LDLCALC 98 11/17/2019   Lab Results  Component Value Date   TRIG 108 11/17/2019   Lab Results  Component Value Date   CHOLHDL 3.4 11/17/2019   Lab Results  Component Value Date   HGBA1C 5.5 11/17/2019      Assessment & Plan:  1. Dizziness Orthostatic vital signs were normal. Heart rate and blood pressure both well controlled. Will check  labs with anemia panel and vitamin d. Will contact patient to review when results are available.  - CBC With Differential - Comprehensive metabolic panel - TSH + free T4 - Iron, TIBC and Ferritin Panel - Vitamin B12 - Vitamin D (25 hydroxy)  2. Other fatigue Check labs with anemia panel and vitamin d for further evaluation.  - CBC With Differential - Comprehensive metabolic panel - TSH + free T4 - Iron, TIBC and Ferritin Panel - Vitamin B12 - Vitamin D 1,25 dihydroxy  3. Vitamin D deficiency Check vitamin d level.  - Vitamin D 1,25 dihydroxy - Vitamin D (25 hydroxy)  4. Seasonal allergic rhinitis due to pollen Possibly causing dizziness. Start flonase nasal spray. Advised her to use up to 2 sprays in both nostrils daily. Encouraged her to take OTC claritin everyday. Suggested she wear a mask when outside when pollen counts are high.  - fluticasone (FLONASE) 50 MCG/ACT nasal spray; Place 2 sprays into both nostrils daily.  Dispense: 16 g; Refill: 6   5. Onychomycosis Advised she continue to use previously prescribed ciclopirox cream. Advised trial of OTC Fungi gone or lotrimin AF along with continuing epsom soaks every night.  May consider treatment with oral terbinafine if topical treatments unsuccessful.    Problem List Items Addressed This Visit       Respiratory   Seasonal allergic rhinitis due to pollen   Relevant Medications   fluticasone (FLONASE) 50 MCG/ACT nasal spray     Musculoskeletal and Integument   Onychomycosis     Other   Dizziness - Primary   Relevant Orders   CBC With Differential   Comprehensive metabolic panel   TSH +  free T4   Iron, TIBC and Ferritin Panel   Vitamin B12   Vitamin D (25 hydroxy)   Other fatigue   Relevant Orders   CBC With Differential   Comprehensive metabolic panel   TSH + free T4   Iron, TIBC and Ferritin Panel   Vitamin B12   Vitamin D 1,25 dihydroxy   Vitamin D deficiency   Relevant Orders   Vitamin D 1,25 dihydroxy    Vitamin D (25 hydroxy)    Meds ordered this encounter  Medications   fluticasone (FLONASE) 50 MCG/ACT nasal spray    Sig: Place 2 sprays into both nostrils daily.    Dispense:  16 g    Refill:  6    Order Specific Question:   Supervising Provider    Answer:   Nani Gasser D [[id number]]    Follow-up: Return in about 3 months (around 01/02/2021) for cpe - she is normal patient of [facility].    Carlean Jews, NP

## 2020-10-03 LAB — CBC WITH DIFFERENTIAL
Basophils Absolute: 0.1 10*3/uL (ref 0.0–0.2)
Basos: 2 %
EOS (ABSOLUTE): 0.3 10*3/uL (ref 0.0–0.4)
Eos: 5 %
Hematocrit: 41.8 % (ref 34.0–46.6)
Hemoglobin: 13.9 g/dL (ref 11.1–15.9)
Immature Grans (Abs): 0 10*3/uL (ref 0.0–0.1)
Immature Granulocytes: 0 %
Lymphocytes Absolute: 1.6 10*3/uL (ref 0.7–3.1)
Lymphs: 33 %
MCH: 30.5 pg (ref 26.6–33.0)
MCHC: 33.3 g/dL (ref 31.5–35.7)
MCV: 92 fL (ref 79–97)
Monocytes Absolute: 0.4 10*3/uL (ref 0.1–0.9)
Monocytes: 8 %
Neutrophils Absolute: 2.6 10*3/uL (ref 1.4–7.0)
Neutrophils: 52 %
RBC: 4.55 x10E6/uL (ref 3.77–5.28)
RDW: 12.2 % (ref 11.7–15.4)
WBC: 5 10*3/uL (ref 3.4–10.8)

## 2020-10-03 LAB — COMPREHENSIVE METABOLIC PANEL
ALT: 16 IU/L (ref 0–32)
AST: 26 IU/L (ref 0–40)
Albumin/Globulin Ratio: 2.1 (ref 1.2–2.2)
Albumin: 4.6 g/dL (ref 3.8–4.8)
Alkaline Phosphatase: 118 IU/L (ref 44–121)
BUN/Creatinine Ratio: 21 (ref 12–28)
BUN: 12 mg/dL (ref 8–27)
Bilirubin Total: 0.8 mg/dL (ref 0.0–1.2)
CO2: 18 mmol/L — ABNORMAL LOW (ref 20–29)
Calcium: 9.8 mg/dL (ref 8.7–10.3)
Chloride: 107 mmol/L — ABNORMAL HIGH (ref 96–106)
Creatinine, Ser: 0.56 mg/dL — ABNORMAL LOW (ref 0.57–1.00)
Globulin, Total: 2.2 g/dL (ref 1.5–4.5)
Glucose: 80 mg/dL (ref 65–99)
Potassium: 4.7 mmol/L (ref 3.5–5.2)
Sodium: 145 mmol/L — ABNORMAL HIGH (ref 134–144)
Total Protein: 6.8 g/dL (ref 6.0–8.5)
eGFR: 101 mL/min/{1.73_m2} (ref >59)

## 2020-10-03 LAB — IRON,TIBC AND FERRITIN PANEL
Ferritin: 173 ng/mL — ABNORMAL HIGH (ref 15–150)
Iron Saturation: 60 % — ABNORMAL HIGH (ref 15–55)
Iron: 169 ug/dL — ABNORMAL HIGH (ref 27–139)
Total Iron Binding Capacity: 281 ug/dL (ref 250–450)
UIBC: 112 ug/dL — ABNORMAL LOW (ref 118–369)

## 2020-10-03 LAB — VITAMIN D 25 HYDROXY (VIT D DEFICIENCY, FRACTURES): Vit D, 25-Hydroxy: 41.1 ng/mL (ref 30.0–100.0)

## 2020-10-03 LAB — TSH+FREE T4
Free T4: 0.98 ng/dL (ref 0.82–1.77)
TSH: 0.486 u[IU]/mL (ref 0.450–4.500)

## 2020-10-03 LAB — VITAMIN B12: Vitamin B-12: 568 pg/mL (ref 232–1245)

## 2020-10-03 NOTE — Progress Notes (Signed)
Please let the patient know that I reviewed her labs. Idon't see anything that would increase dizziness. Her iron saturation is a little high. If she is taking anything with extra iron, she may want to take every other day. This should not be causing dizziness. All labs looked ok. Thanks.

## 2021-01-02 ENCOUNTER — Encounter: Payer: BC Managed Care – PPO | Admitting: Physician Assistant

## 2021-01-11 ENCOUNTER — Other Ambulatory Visit: Payer: Self-pay | Admitting: Physician Assistant

## 2021-01-11 DIAGNOSIS — I471 Supraventricular tachycardia: Secondary | ICD-10-CM

## 2021-02-03 ENCOUNTER — Encounter: Payer: Self-pay | Admitting: Physician Assistant

## 2021-02-03 DIAGNOSIS — B351 Tinea unguium: Secondary | ICD-10-CM

## 2021-02-04 MED ORDER — CICLOPIROX OLAMINE 0.77 % EX CREA: TOPICAL_CREAM | Freq: Two times a day (BID) | CUTANEOUS | 0 refills | Status: DC

## 2021-02-22 ENCOUNTER — Ambulatory Visit: Payer: BC Managed Care – PPO | Admitting: Physician Assistant

## 2021-02-22 ENCOUNTER — Other Ambulatory Visit: Payer: Self-pay

## 2021-02-22 ENCOUNTER — Encounter: Payer: Self-pay | Admitting: Physician Assistant

## 2021-02-22 VITALS — BP 112/67 | HR 62 | Temp 97.5°F | Ht 64.0 in | Wt 142.0 lb

## 2021-02-22 DIAGNOSIS — Z23 Encounter for immunization: Secondary | ICD-10-CM | POA: Diagnosis not present

## 2021-02-22 DIAGNOSIS — R0989 Other specified symptoms and signs involving the circulatory and respiratory systems: Secondary | ICD-10-CM

## 2021-02-22 DIAGNOSIS — B351 Tinea unguium: Secondary | ICD-10-CM | POA: Diagnosis not present

## 2021-02-22 DIAGNOSIS — J309 Allergic rhinitis, unspecified: Secondary | ICD-10-CM | POA: Diagnosis not present

## 2021-02-22 LAB — POCT RESPIRATORY SYNCYTIAL VIRUS: RSV Rapid Ag: NEGATIVE

## 2021-02-22 LAB — POCT INFLUENZA A/B
Influenza A, POC: NEGATIVE
Influenza B, POC: NEGATIVE

## 2021-02-22 MED ORDER — TRIAMCINOLONE ACETONIDE 55 MCG/ACT NA AERO: 1.0000 | INHALATION_SPRAY | Freq: Two times a day (BID) | NASAL | 2 refills | Status: AC

## 2021-02-22 NOTE — Patient Instructions (Signed)
Allergic Rhinitis, Adult  Allergic rhinitis is an allergic reaction that affects the mucous membraneinside the nose. The mucous membrane is the tissue that produces mucus. There are two types of allergic rhinitis: Seasonal. This type is also called hay fever and happens only during certain seasons. Perennial. This type can happen at any time of the year. Allergic rhinitis cannot be spread from person to person. This condition can bemild, moderate, or severe. It can develop at any age and may be outgrown. What are the causes? This condition is caused by allergens. These are things that can cause an allergic reaction. Allergens may differ for seasonal allergic rhinitis and perennial allergic rhinitis. Seasonal allergic rhinitis is triggered by pollen. Pollen can come from grasses, trees, and weeds. Perennial allergic rhinitis may be triggered by: Dust mites. Proteins in a pet's urine, saliva, or dander. Dander is dead skin cells from a pet. Smoke, mold, or car fumes. What increases the risk? You are more likely to develop this condition if you have a family history of allergies or other conditions related to allergies, including: Allergic conjunctivitis. This is inflammation of parts of the eyes and eyelids. Asthma. This condition affects the lungs and makes it hard to breathe. Atopic dermatitis or eczema. This is long term (chronic) inflammation of the skin. Food allergies. What are the signs or symptoms? Symptoms of this condition include: Sneezing or coughing. A stuffy nose (nasal congestion), itchy nose, or nasal discharge. Itchy eyes and tearing of the eyes. A feeling of mucus dripping down the back of your throat (postnasal drip). Trouble sleeping. Tiredness or fatigue. Headache. Sore throat. How is this diagnosed? This condition may be diagnosed with your symptoms, medical history, and physical exam. Your health care provider may check for related conditions, such  as: Asthma. Pink eye. This is eye inflammation caused by infection (conjunctivitis). Ear infection. Upper respiratory infection. This is an infection in the nose, throat, or upper airways. You may also have tests to find out which allergens trigger your symptoms.These may include skin tests or blood tests. How is this treated? There is no cure for this condition, but treatment can help control symptoms. Treatment may include: Taking medicines that block allergy symptoms, such as corticosteroids and antihistamines. Medicine may be given as a shot, nasal spray, or pill. Avoiding any allergens. Being exposed again and again to tiny amounts of allergens to help you build a defense against allergens (immunotherapy). This is done if other treatments have not helped. It may include: Allergy shots. These are injected medicines that have small amounts of allergen in them. Sublingual immunotherapy. This involves taking small doses of a medicine with allergen in it under your tongue. If these treatments do not work, your health care provider may prescribe newer,stronger medicines. Follow these instructions at home: Avoiding allergens Find out what you are allergic to and avoid those allergens. These are some things you can do to help avoid allergens: If you have perennial allergies: Replace carpet with wood, tile, or vinyl flooring. Carpet can trap dander and dust. Do not smoke. Do not allow smoking in your home. Change your heating and air conditioning filters at least once a month. If you have seasonal allergies, take these steps during allergy season: Keep windows closed as much as possible. Plan outdoor activities when pollen counts are lowest. Check pollen counts before you plan outdoor activities. When coming indoors, change clothing and shower before sitting on furniture or bedding. If you have a pet in the house that   produces allergens: Keep the pet out of the bedroom. Vacuum, sweep, and dust  regularly. General instructions Take over-the-counter and prescription medicines only as told by your health care provider. Drink enough fluid to keep your urine pale yellow. Keep all follow-up visits as told by your health care provider. This is important. Where to find more information American Academy of Allergy, Asthma & Immunology: www.aaaai.org Contact a health care provider if: You have a fever. You develop a cough that does not go away. You make whistling sounds when you breathe (wheeze). Your symptoms slow you down or stop you from doing your normal activities each day. Get help right away if: You have shortness of breath. This symptom may represent a serious problem that is an emergency. Do not wait to see if the symptom will go away. Get medical help right away. Call your local emergency services (911 in the [place] Do not drive yourself to the hospital. Summary Allergic rhinitis may be managed by taking medicines as directed and avoiding allergens. If you have seasonal allergies, keep windows closed as much as possible during allergy season. Contact your health care provider if you develop a fever or a cough that does not go away. This information is not intended to replace advice given to you by your health care provider. Make sure you discuss any questions you have with your healthcare provider. Document Revised: 05/27/2019 Document Reviewed: 04/05/2019 Elsevier Patient Education  2022 Else[employer] Inc.  

## 2021-02-22 NOTE — Progress Notes (Signed)
Acute Office Visit  Subjective:    Patient ID: Veronica Ray, female    DOB: 09/26/1955, 65 y.o.   MRN: [id number]  Chief Complaint  Patient presents with   Nail Problem   Nasal Congestion    HPI Patient is in today for c/o runny nose, throat discomfort, felt like her lymph nodes were swollen too. Wednesday felt tired. No fever, headache, sinus pressure, nasal congestion, n/v/d, or chills. Reports some of her co-workers were sick too recently. Did an at-home Covid test yesterday which resulted negative. Patient states has been taking allergy medication (Claritin) since her last visit and tried the nasal spray (Flonase) but it caused nose bleed. Also reports chronic toenail fungus of both feet, in the past was using ciclopirox consistently but since she had surgery was not as diligent. Has been more consistent the past 6 months.   Past Medical History:  Diagnosis Date   Allergy    SVT (supraventricular tachycardia) (HCC)     Past Surgical History:  Procedure Laterality Date   CESAREAN SECTION      Family History  Problem Relation Age of Onset   Cancer Paternal Grandmother    Cancer Paternal Grandfather     Social History   Socioeconomic History   Marital status: Married    Spouse name: Not on file   Number of children: Not on file   Years of education: Not on file   Highest education level: Not on file  Occupational History   Not on file  Tobacco Use   Smoking status: Never   Smokeless tobacco: Never  Vaping Use   Vaping Use: Never used  Substance and Sexual Activity   Alcohol use: No   Drug use: No   Sexual activity: Not on file  Other Topics Concern   Not on file  Social History Narrative   Not on file   Social Determinants of Health   Financial Resource Strain: Not on file  Food Insecurity: Not on file  Transportation Needs: Not on file  Physical Activity: Not on file  Stress: Not on file  Social Connections: Not on file  Intimate Partner Violence:  Not on file    Outpatient Medications Prior to Visit  Medication Sig Dispense Refill   ciclopirox (LOPROX) 0.77 % cream Apply topically 2 (two) times daily. 30 g 0   metoprolol succinate (TOPROL-XL) 25 MG 24 hr tablet Take 1 tablet (25 mg total) by mouth daily. **PLEASE CONTACT OUR OFFICE TO SCHEDULE A FOLLOW UP FOR FUTURE MED REFILLS** 30 tablet 0   Multiple Vitamins-Minerals (MULTIVITAMIN WITH MINERALS) tablet Take 0.5 tablets by mouth daily.     fluticasone (FLONASE) 50 MCG/ACT nasal spray Place 2 sprays into both nostrils daily. 16 g 6   No facility-administered medications prior to visit.    No Known Allergies  Review of Systems Review of Systems:  A fourteen system review of systems was performed and found to be positive as per HPI.  Objective:    Physical Exam General:  Well Developed, well nourished, appropriate for stated age.  Neuro:  Alert and oriented,  extra-ocular muscles intact  HEENT:  Normocephalic, atraumatic, no frontal or maxillary sinus, normal conjunctiva, no eyelid swelling, PERRL, normal TM's of both ears with mild fluid behind right TM, clear nasal discharge with boggy turbinates, mild erythema of posterior oropharynx, neck supple, +cervical adenopathy (left submandibular gland)  Skin:  no gross rash, warm, pink. Cardiac:  RRR, S1 S2 Respiratory:  CTA B/L w/o wheezing,  crackles or rales, Not using accessory muscles, speaking in full sentences- unlabored. Extremities: Yellow discolored, thick nails noted of several toenails including the great and little toes. Vascular:  Ext warm, no cyanosis apprec.; cap RF less 2 sec. Psych:  No HI/SI, judgement and insight good, Euthymic mood. Full Affect.  BP 112/67   Pulse 62   Temp (!) 97.5 F (36.4 C)   Ht 5' 4"  (1.626 m)   Wt 142 lb (64.4 kg)   SpO2 100%   BMI 24.37 kg/m  Wt Readings from Last 3 Encounters:  02/22/21 142 lb (64.4 kg)  10/02/20 139 lb 9.6 oz (63.3 kg)  08/20/20 143 lb 4.8 oz (65 kg)     Health Maintenance Due  Topic Date Due   Hepatitis C Screening  Never done   COLONOSCOPY (Pts 45-20yr Insurance coverage will need to be confirmed)  Never done   Zoster Vaccines- Shingrix (1 of 2) Never done   Pneumonia Vaccine 66 Years old (1 - PCV) Never done   DEXA SCAN  Never done    There are no preventive care reminders to display for this patient.   Lab Results  Component Value Date   TSH 0.486 10/02/2020   Lab Results  Component Value Date   WBC 5.0 10/02/2020   HGB 13.9 10/02/2020   HCT 41.8 10/02/2020   MCV 92 10/02/2020   PLT 136 (L) 12/27/2019   Lab Results  Component Value Date   NA 145 (H) 10/02/2020   K 4.7 10/02/2020   CO2 18 (L) 10/02/2020   GLUCOSE 80 10/02/2020   BUN 12 10/02/2020   CREATININE 0.56 (L) 10/02/2020   BILITOT 0.8 10/02/2020   ALKPHOS 118 10/02/2020   AST 26 10/02/2020   ALT 16 10/02/2020   PROT 6.8 10/02/2020   ALBUMIN 4.6 10/02/2020   CALCIUM 9.8 10/02/2020   ANIONGAP 7 12/27/2019   EGFR 101 10/02/2020   Lab Results  Component Value Date   CHOL 168 11/17/2019   Lab Results  Component Value Date   HDL 50 11/17/2019   Lab Results  Component Value Date   LDLCALC 98 11/17/2019   Lab Results  Component Value Date   TRIG 108 11/17/2019   Lab Results  Component Value Date   CHOLHDL 3.4 11/17/2019   Lab Results  Component Value Date   HGBA1C 5.5 11/17/2019       Assessment & Plan:   Problem List Items Addressed This Visit       Musculoskeletal and Integument   Onychomycosis   Other Visit Diagnoses     Allergic rhinitis, unspecified seasonality, unspecified trigger    -  Primary   Relevant Medications   triamcinolone (NASACORT) 55 MCG/ACT AERO nasal inhaler   Runny nose       Relevant Orders   POCT Influenza A/B (Completed)   POCT respiratory syncytial virus (Completed)   Need for influenza vaccination       Relevant Orders   Flu Vaccine QUAD High Dose(Fluad) (Completed)      Allergic  rhinitis: -Fluenza and RSV swab collected in office and resulted negative. Discussed with patient s/sx are suggestive of allergic rhinitis. Recommend to continue with oral antihistamine (Claritin) and use different nasal spray, will send rx for Nasacort. Follow up if symptoms fail to improve or worsen.  Onychomycosis: -Discussed with patient management options including oral terbinafine and potential side effects. Patient prefers to continue with topical ciclopirox. Discussed topical antifungal can be used for up to 48  weeks.     Meds ordered this encounter  Medications   triamcinolone (NASACORT) 55 MCG/ACT AERO nasal inhaler    Sig: Place 1 spray into the nose 2 (two) times daily.    Dispense:  1 each    Refill:  2    Order Specific Question:   Supervising Provider    Answer:   Beatrice Lecher D [[id number]]      Lorrene Reid, PA-C

## 2021-04-05 ENCOUNTER — Ambulatory Visit (INDEPENDENT_AMBULATORY_CARE_PROVIDER_SITE_OTHER): Payer: BC Managed Care – PPO | Admitting: Physician Assistant

## 2021-04-05 ENCOUNTER — Encounter: Payer: Self-pay | Admitting: Physician Assistant

## 2021-04-05 ENCOUNTER — Encounter: Payer: BC Managed Care – PPO | Admitting: Physician Assistant

## 2021-04-05 ENCOUNTER — Other Ambulatory Visit: Payer: Self-pay

## 2021-04-05 VITALS — BP 118/71 | HR 64 | Temp 97.3°F | Ht 64.0 in | Wt 142.0 lb

## 2021-04-05 DIAGNOSIS — Z1321 Encounter for screening for nutritional disorder: Secondary | ICD-10-CM

## 2021-04-05 DIAGNOSIS — Z1211 Encounter for screening for malignant neoplasm of colon: Secondary | ICD-10-CM | POA: Diagnosis not present

## 2021-04-05 DIAGNOSIS — Z8639 Personal history of other endocrine, nutritional and metabolic disease: Secondary | ICD-10-CM

## 2021-04-05 DIAGNOSIS — I471 Supraventricular tachycardia: Secondary | ICD-10-CM

## 2021-04-05 DIAGNOSIS — Z0000 Encounter for general adult medical examination without abnormal findings: Secondary | ICD-10-CM | POA: Diagnosis not present

## 2021-04-05 DIAGNOSIS — Z130 Encounter for screening for diseases of the blood and blood-forming organs and certain disorders involving the immune mechanism: Secondary | ICD-10-CM

## 2021-04-05 DIAGNOSIS — B351 Tinea unguium: Secondary | ICD-10-CM | POA: Diagnosis not present

## 2021-04-05 DIAGNOSIS — Z1329 Encounter for screening for other suspected endocrine disorder: Secondary | ICD-10-CM

## 2021-04-05 DIAGNOSIS — Z13228 Encounter for screening for other metabolic disorders: Secondary | ICD-10-CM

## 2021-04-05 MED ORDER — CICLOPIROX OLAMINE 0.77 % EX CREA: TOPICAL_CREAM | Freq: Two times a day (BID) | CUTANEOUS | 0 refills | Status: DC

## 2021-04-05 MED ORDER — METOPROLOL SUCCINATE ER 25 MG PO TB24: 25.0000 mg | ORAL_TABLET | Freq: Every day | ORAL | 1 refills | Status: DC

## 2021-04-05 NOTE — Patient Instructions (Signed)
Preventive Care 65 Years and Older, Female °Preventive care refers to lifestyle choices and visits with your health care provider that can promote health and wellness. Preventive care visits are also called wellness exams. °What can I expect for my preventive care visit? °Counseling °Your health care provider may ask you questions about your: °Medical history, including: °Past medical problems. °Family medical history. °Pregnancy and menstrual history. °History of falls. °Current health, including: °Memory and ability to understand (cognition). °Emotional well-being. °Home life and relationship well-being. °Sexual activity and sexual health. °Lifestyle, including: °Alcohol, nicotine or tobacco, and drug use. °Access to firearms. °Diet, exercise, and sleep habits. °Work and work environment. °Sunscreen use. °Safety issues such as seatbelt and bike helmet use. °Physical exam °Your health care provider will check your: °Height and weight. These may be used to calculate your BMI (body mass index). BMI is a measurement that tells if you are at a healthy weight. °Waist circumference. This measures the distance around your waistline. This measurement also tells if you are at a healthy weight and may help predict your risk of certain diseases, such as type 2 diabetes and high blood pressure. °Heart rate and blood pressure. °Body temperature. °Skin for abnormal spots. °What immunizations do I need? °Vaccines are usually given at various ages, according to a schedule. Your health care provider will recommend vaccines for you based on your age, medical history, and lifestyle or other factors, such as travel or where you work. °What tests do I need? °Screening °Your health care provider may recommend screening tests for certain conditions. This may include: °Lipid and cholesterol levels. °Hepatitis C test. °Hepatitis B test. °HIV (human immunodeficiency virus) test. °STI (sexually transmitted infection) testing, if you are at  risk. °Lung cancer screening. °Colorectal cancer screening. °Diabetes screening. This is done by checking your blood sugar (glucose) after you have not eaten for a while (fasting). °Mammogram. Talk with your health care provider about how often you should have regular mammograms. °BRCA-related cancer screening. This may be done if you have a family history of breast, ovarian, tubal, or peritoneal cancers. °Bone density scan. This is done to screen for osteoporosis. °Talk with your health care provider about your test results, treatment options, and if necessary, the need for more tests. °Follow these instructions at home: °Eating and drinking ° °Eat a diet that includes fresh fruits and vegetables, whole grains, lean protein, and low-fat dairy products. Limit your intake of foods with high amounts of sugar, saturated fats, and salt. °Take vitamin and mineral supplements as recommended by your health care provider. °Do not drink alcohol if your health care provider tells you not to drink. °If you drink alcohol: °Limit how much you have to 0-1 drink a day. °Know how much alcohol is in your drink. In the [place] one drink equals one 12 oz bottle of beer (355 mL), one 5 oz glass of wine (148 mL), or one 1½ oz glass of hard liquor (44 mL). °Lifestyle °Brush your teeth every morning and night with fluoride toothpaste. Floss one time each day. °Exercise for at least 30 minutes 5 or more days each week. °Do not use any products that contain nicotine or tobacco. These products include cigarettes, chewing tobacco, and vaping devices, such as e-cigarettes. If you need help quitting, ask your health care provider. °Do not use drugs. °If you are sexually active, practice safe sex. Use a condom or other form of protection in order to prevent STIs. °Take aspirin only as told by your   health care provider. Make sure that you understand how much to take and what form to take. Work with your health care provider to find out whether it  is safe and beneficial for you to take aspirin daily. Ask your health care provider if you need to take a cholesterol-lowering medicine (statin). Find healthy ways to manage stress, such as: Meditation, yoga, or listening to music. Journaling. Talking to a trusted person. Spending time with friends and family. Minimize exposure to UV radiation to reduce your risk of skin cancer. Safety Always wear your seat belt while driving or riding in a vehicle. Do not drive: If you have been drinking alcohol. Do not ride with someone who has been drinking. When you are tired or distracted. While texting. If you have been using any mind-altering substances or drugs. Wear a helmet and other protective equipment during sports activities. If you have firearms in your house, make sure you follow all gun safety procedures. What's next? Visit your health care provider once a year for an annual wellness visit. Ask your health care provider how often you should have your eyes and teeth checked. Stay up to date on all vaccines. This information is not intended to replace advice given to you by your health care provider. Make sure you discuss any questions you have with your health care provider. Document Revised: 10/03/2020 Document Reviewed: 10/03/2020 Elsevier Patient Education  [place].

## 2021-04-05 NOTE — Progress Notes (Signed)
Subjective:     Veronica Ray is a 65 y.o. female and is here for a comprehensive physical exam. The patient reports no problems.  Social History   Socioeconomic History   Marital status: Married    Spouse name: Not on file   Number of children: Not on file   Years of education: Not on file   Highest education level: Not on file  Occupational History   Not on file  Tobacco Use   Smoking status: Never   Smokeless tobacco: Never  Vaping Use   Vaping Use: Never used  Substance and Sexual Activity   Alcohol use: No   Drug use: No   Sexual activity: Not on file  Other Topics Concern   Not on file  Social History Narrative   Not on file   Social Determinants of Health   Financial Resource Strain: Not on file  Food Insecurity: Not on file  Transportation Needs: Not on file  Physical Activity: Not on file  Stress: Not on file  Social Connections: Not on file  Intimate Partner Violence: Not on file   Health Maintenance  Topic Date Due   Hepatitis C Screening  Never done   COLONOSCOPY (Pts 45-61yrs Insurance coverage will need to be confirmed)  Never done   Zoster Vaccines- Shingrix (1 of 2) Never done   Pneumonia Vaccine 96+ Years old (1 - PCV) Never done   DEXA SCAN  Never done   MAMMOGRAM  03/30/2021   TETANUS/TDAP  11/22/2025   INFLUENZA VACCINE  Completed   COVID-19 Vaccine  Completed   HPV VACCINES  Aged Out    The following portions of the patient's history were reviewed and updated as appropriate: allergies, current medications, past family history, past medical history, past social history, past surgical history, and problem list.  Review of Systems Pertinent items noted in HPI and remainder of comprehensive ROS otherwise negative.   Objective:    BP 118/71    Pulse 64    Temp (!) 97.3 F (36.3 C)    Ht 5\' 4"  (1.626 m)    Wt 142 lb (64.4 kg)    SpO2 100%    BMI 24.37 kg/m  General appearance: alert, cooperative, and no distress Head: Normocephalic, without  obvious abnormality, atraumatic Eyes: conjunctivae/corneas clear. PERRL, EOM's intact. Fundi benign. Ears: normal TM's and external ear canals both ears Nose: Nares normal. Septum midline. Mucosa normal. No drainage or sinus tenderness. Throat: lips, mucosa, and tongue normal; teeth and gums normal Neck: no adenopathy, no JVD, supple, symmetrical, trachea midline, and thyroid: normal to inspection and palpation Back: symmetric, no curvature. ROM normal. No CVA tenderness. Lungs: clear to auscultation bilaterally Breasts: normal appearance, no masses or tenderness Heart: regular rate and rhythm and S1, S2 normal Abdomen: soft, non-tender; bowel sounds normal; no masses,  no organomegaly Pelvic: not indicated; post-menopausal, no abnormal Pap smears in past Extremities: extremities normal, atraumatic, no cyanosis or edema Pulses: 2+ and symmetric Skin: Skin color, texture, turgor normal. No rashes or lesions Lymph nodes: Cervical adenopathy: normal and Supraclavicular adenopathy: normal Neurologic: Grossly normal    Assessment:    Healthy female exam.     Plan:  -Will obtain fasting labs. -Continue current medication regimen. -Patient agreeable to Cologuard. Will contact insurance to inquire coverage of mammogram.  -Follow a heart health diet low in fat and carbohydrates. -Continue to stay as active as possible. -Follow up in 6 months for SVT, HLD  See After Visit Summary for  Counseling Recommendations

## 2021-04-06 LAB — CBC WITH DIFFERENTIAL/PLATELET
Basophils Absolute: 0.1 10*3/uL (ref 0.0–0.2)
Basos: 1 %
EOS (ABSOLUTE): 0.2 10*3/uL (ref 0.0–0.4)
Eos: 4 %
Hematocrit: 41.5 % (ref 34.0–46.6)
Hemoglobin: 14.2 g/dL (ref 11.1–15.9)
Immature Grans (Abs): 0 10*3/uL (ref 0.0–0.1)
Immature Granulocytes: 0 %
Lymphocytes Absolute: 1.7 10*3/uL (ref 0.7–3.1)
Lymphs: 34 %
MCH: 31.1 pg (ref 26.6–33.0)
MCHC: 34.2 g/dL (ref 31.5–35.7)
MCV: 91 fL (ref 79–97)
Monocytes Absolute: 0.4 10*3/uL (ref 0.1–0.9)
Monocytes: 9 %
Neutrophils Absolute: 2.6 10*3/uL (ref 1.4–7.0)
Neutrophils: 52 %
Platelets: 185 10*3/uL (ref 150–450)
RBC: 4.57 x10E6/uL (ref 3.77–5.28)
RDW: 12.1 % (ref 11.7–15.4)
WBC: 5 10*3/uL (ref 3.4–10.8)

## 2021-04-06 LAB — COMPREHENSIVE METABOLIC PANEL
ALT: 14 IU/L (ref 0–32)
AST: 23 IU/L (ref 0–40)
Albumin/Globulin Ratio: 1.9 (ref 1.2–2.2)
Albumin: 4.3 g/dL (ref 3.8–4.8)
Alkaline Phosphatase: 126 IU/L — ABNORMAL HIGH (ref 44–121)
BUN/Creatinine Ratio: 26 (ref 12–28)
BUN: 16 mg/dL (ref 8–27)
Bilirubin Total: 0.8 mg/dL (ref 0.0–1.2)
CO2: 25 mmol/L (ref 20–29)
Calcium: 9.2 mg/dL (ref 8.7–10.3)
Chloride: 103 mmol/L (ref 96–106)
Creatinine, Ser: 0.61 mg/dL (ref 0.57–1.00)
Globulin, Total: 2.3 g/dL (ref 1.5–4.5)
Glucose: 90 mg/dL (ref 70–99)
Potassium: 4.1 mmol/L (ref 3.5–5.2)
Sodium: 142 mmol/L (ref 134–144)
Total Protein: 6.6 g/dL (ref 6.0–8.5)
eGFR: 99 mL/min/{1.73_m2} (ref >59)

## 2021-04-06 LAB — TSH: TSH: 0.708 u[IU]/mL (ref 0.450–4.500)

## 2021-04-06 LAB — LIPID PANEL
Chol/HDL Ratio: 3.7 ratio (ref 0.0–4.4)
Cholesterol, Total: 178 mg/dL (ref 100–199)
HDL: 48 mg/dL (ref >39)
LDL Chol Calc (NIH): 99 mg/dL (ref 0–99)
Triglycerides: 178 mg/dL — ABNORMAL HIGH (ref 0–149)
VLDL Cholesterol Cal: 31 mg/dL (ref 5–40)

## 2021-04-06 LAB — HEMOGLOBIN A1C
Est. average glucose Bld gHb Est-mCnc: 114 mg/dL
Hgb A1c MFr Bld: 5.6 % (ref 4.8–5.6)

## 2021-04-06 LAB — VITAMIN D 25 HYDROXY (VIT D DEFICIENCY, FRACTURES): Vit D, 25-Hydroxy: 29.7 ng/mL — ABNORMAL LOW (ref 30.0–100.0)

## 2021-05-07 LAB — COLOGUARD: COLOGUARD: NEGATIVE

## 2021-08-27 ENCOUNTER — Other Ambulatory Visit: Payer: Self-pay | Admitting: Physician Assistant

## 2021-08-27 DIAGNOSIS — I471 Supraventricular tachycardia: Secondary | ICD-10-CM

## 2021-10-07 ENCOUNTER — Ambulatory Visit: Payer: BC Managed Care – PPO | Admitting: Physician Assistant

## 2021-10-10 ENCOUNTER — Ambulatory Visit: Payer: BC Managed Care – PPO | Admitting: Physician Assistant

## 2021-10-10 ENCOUNTER — Encounter: Payer: Self-pay | Admitting: Physician Assistant

## 2021-10-11 ENCOUNTER — Other Ambulatory Visit: Payer: Self-pay | Admitting: Physician Assistant

## 2021-10-11 DIAGNOSIS — Z1231 Encounter for screening mammogram for malignant neoplasm of breast: Secondary | ICD-10-CM

## 2021-10-14 ENCOUNTER — Ambulatory Visit
Admission: RE | Admit: 2021-10-14 | Discharge: 2021-10-14 | Disposition: A | Discharge status: Home / Self Care | Payer: BC Managed Care – PPO | Source: Ambulatory Visit | Attending: Physician Assistant | Admitting: Physician Assistant

## 2021-10-14 DIAGNOSIS — Z1231 Encounter for screening mammogram for malignant neoplasm of breast: Secondary | ICD-10-CM

## 2021-10-17 ENCOUNTER — Ambulatory Visit (INDEPENDENT_AMBULATORY_CARE_PROVIDER_SITE_OTHER): Payer: BC Managed Care – PPO | Admitting: Physician Assistant

## 2021-10-17 ENCOUNTER — Encounter: Payer: Self-pay | Admitting: Physician Assistant

## 2021-10-17 VITALS — BP 102/68 | HR 90 | Temp 97.7°F | Ht 64.0 in | Wt 141.0 lb

## 2021-10-17 DIAGNOSIS — I471 Supraventricular tachycardia: Secondary | ICD-10-CM | POA: Diagnosis not present

## 2021-10-17 DIAGNOSIS — R21 Rash and other nonspecific skin eruption: Secondary | ICD-10-CM

## 2021-10-17 DIAGNOSIS — E7800 Pure hypercholesterolemia, unspecified: Secondary | ICD-10-CM | POA: Diagnosis not present

## 2021-10-17 DIAGNOSIS — B351 Tinea unguium: Secondary | ICD-10-CM

## 2021-10-17 MED ORDER — CLOBETASOL PROPIONATE 0.05 % EX SOLN: 1.0000 | Freq: Two times a day (BID) | CUTANEOUS | 0 refills | Status: DC

## 2021-10-17 MED ORDER — TERBINAFINE HCL 250 MG PO TABS: 250.0000 mg | ORAL_TABLET | Freq: Every day | ORAL | 0 refills | Status: DC

## 2021-10-17 NOTE — Patient Instructions (Signed)
Supraventricular Tachycardia, Adult Supraventricular tachycardia (SVT) is a type of abnormal heart rhythm. It causes the heart to beat very quickly. SVT can start suddenly and last for a short time, which is called paroxysmal SVT, or it may last longer and require specialized treatment to return the heart rhythm to normal. A normal resting heart rate is 60-100 beats per minute. During an episode of SVT, your heart rate may be higher than 150 beats per minute. Episodes of SVT can be frightening, but they are usually not dangerous. However, if episodes happen several times a day or last longer than a few seconds, they may lead to heart failure. What are the causes?  Usually, a normal heartbeat starts when an area called the sinoatrial node releases an electrical signal. In SVT, other areas of the heart send out electrical signals that interfere with the signal from the sinoatrial node. The cause of this abnormal electrical activity is not known. What increases the risk? You are more likely to develop this condition if you are: Middle aged or younger. Female. The following factors may also make you more likely to develop this condition: Stress or anxiety. Tiredness. Smoking. Stimulant drugs, such as cocaine and methamphetamine. Alcohol. Caffeine. Pregnancy. Having any of these conditions: A thyroid condition. Diabetes mellitus. Obstructive sleep apnea. What are the signs or symptoms? Symptoms of this condition include: A pounding heart. A feeling that the heart is skipping beats (palpitations). Weakness. Shortness of breath. Tightness or pain in your chest. Light-headedness or dizziness. Anxiety. Sweating. Nausea. Fainting. Fatigue or tiredness. A mild episode may not cause symptoms. How is this diagnosed? This condition may be diagnosed based on: Your symptoms. A physical exam. If you have an episode of SVT during the exam, the health care provider may be able to diagnose SVT by  listening to your heart and feeling your pulse. Tests. These may include: An electrocardiogram (ECG). This test is done to check for problems with electrical activity in the heart. A Holter monitor or event monitor test. This test involves wearing a portable device that monitors your heart rate over time. An echocardiogram. This test involves taking an image of your heart using sound waves. It is done to rule out other causes of a fast heart rate. A stress echocardiogram. This test involves doing an echocardiogram when you are at rest and after exercise. Blood tests. An electrophysiology study (EPS). This tests the electrical activity in your heart to find where the abnormal heart rhythm is coming from using cardiac catheters. How is this treated? This condition may be treated with: Vagal nerve stimulation. This involves stimulating your vagus nerve, which is a nerve that runs from the chest, through the neck, to the lower part of the brain. Stimulating this nerve can slow down the heart. It is often the first and only treatment that is needed for this condition. Work with your health care provider to find which technique works best for you. Ways to do this treatment include: Laying on your back, then holding your breath and pushing, as though you are having a bowel movement. Massaging an area on one side of your neck, below your jaw. Do not try this yourself. Only a health care provider should do this. If done the wrong way, it can lead to a stroke. Bending forward with your head between your legs. Coughing while bending forward with your head between your legs. Applying an ice-cold, wet towel to your face. Medicines that prevent attacks. Medicine to stop an  attack. The medicine is given through an IV at the hospital. A small electric shock (cardioversion) that stops an attack. Before you get the shock, you will get medicine to make you fall asleep. Radiofrequency ablation. In this procedure, a  small, thin tube (catheter) is used to send radiofrequency energy to the area of tissue that is causing the rapid heartbeats. The energy kills the cells and helps your heart keep a normal rhythm. You may have this treatment if you have symptoms of SVT often. If you do not have symptoms, you may not need treatment. Follow these instructions at home: Stress Avoid stressful situations when possible. Find healthy ways of managing stress, such as: Taking part in relaxing activities, such as yoga, meditation, or being out in nature. Listening to relaxing music. Practicing relaxation techniques, such as deep breathing. Leading a healthy lifestyle. This involves getting plenty of sleep, exercising, and eating a balanced diet. Attending counseling or talk therapy with a mental health professional. Lifestyle  Try to get at least 7 hours of sleep each night. Do not use any products that contain nicotine or tobacco. These products include cigarettes, chewing tobacco, and vaping devices, such as e-cigarettes. If you need help quitting, ask your health care provider. Do not drink alcohol if it triggers episodes of SVT. If alcohol does not seem to trigger episodes, limit your alcohol intake. If you drink alcohol: Limit how much you have to: 0-1 drink a day for women who are not pregnant. 0-2 drinks a day for men. Know how much alcohol is in your drink. In the [place] one drink equals one 12 oz bottle of beer (355 mL), one 5 oz glass of wine (148 mL), or one 1 oz glass of hard liquor (44 mL). Be aware of how caffeine affects your condition. If caffeine: Triggers episodes of SVT, do not eat, drink, or use anything with caffeine in it. Does not seem to trigger episodes, consume caffeine in moderation. Do not use stimulant drugs. If you need help quitting, talk with your health care provider. General instructions Maintain a healthy weight. Exercise regularly. Ask your health care provider to suggest some good  activities for you. Aim for one or a combination of the following: 150 minutes per week of moderate exercise, such as walking or yoga. 75 minutes per week of vigorous exercise, such as running or swimming. Perform vagus nerve stimulation as directed by your health care provider. Take over-the-counter and prescription medicines only as told by your health care provider. Keep all follow-up visits. This is important. Contact a health care provider if: You have episodes of SVT more often than before. Episodes of SVT last longer than before. Vagus nerve stimulation is no longer helping. You have new symptoms. Get help right away if: You have chest pain. Your symptoms get worse. You have trouble breathing. You have an episode of SVT that lasts longer than 20 minutes. You faint. These symptoms may represent a serious problem that is an emergency. Do not wait to see if the symptoms will go away. Get medical help right away. Call your local emergency services (911 in the [place] Do not drive yourself to the hospital. Summary Supraventricular tachycardia (SVT) is a type of abnormal heart rhythm. During an episode of SVT, your heart rate may be higher than 150 beats per minute. If you do not have symptoms, you may not need treatment. This information is not intended to replace advice given to you by your health care provider. Make sure  you discuss any questions you have with your health care provider. Document Revised: 11/19/2019 Document Reviewed: 11/19/2019 Elsevier Patient Education  2023 [employer].

## 2021-10-17 NOTE — Assessment & Plan Note (Signed)
-  Stable. -Continue current medication regimen.  -Will continue to monitor. 

## 2021-10-17 NOTE — Assessment & Plan Note (Addendum)
-  Last lipid panel LDL 99. Patient non-fasting today. Will repeat lipid panel with annual physical. Recommend to continue with a low fat diet. -The 10-year ASCVD risk score (Arnett DK, et al., 2019) is: 4%

## 2021-10-17 NOTE — Progress Notes (Signed)
Established patient visit   Patient: Veronica Ray   DOB: 1956-04-20   66 y.o. Female  MRN: [id number] Visit Date: 10/17/2021  Chief Complaint  Patient presents with   Follow-up   Subjective    HPI  Patient presents for chronic follow-up. Patient reports was recently sick like COVID symptoms but feeling better. Did two at home Covid tests which were negative. Also reports Tuesday night woke up and got dizzy. Dizziness has not re-occurred. Also reports has an itchy rash at the back of her neck. Reports no chest pain or palpitations. Continues with metoprolol 25 mg.   HLD: Pt continues with plant based diet. Stays active with outdoor activities.    Medications: Outpatient Medications Prior to Visit  Medication Sig   metoprolol succinate (TOPROL-XL) 25 MG 24 hr tablet TAKE 1 TABLET DAILY   Multiple Vitamins-Minerals (MULTIVITAMIN WITH MINERALS) tablet Take 0.5 tablets by mouth daily.   triamcinolone (NASACORT) 55 MCG/ACT AERO nasal inhaler Place 1 spray into the nose 2 (two) times daily.   [DISCONTINUED] ciclopirox (LOPROX) 0.77 % cream Apply topically 2 (two) times daily.   No facility-administered medications prior to visit.    Review of Systems A fourteen system review of systems was performed and found to be positive as per HPI.  Last CBC Lab Results  Component Value Date   WBC 5.0 04/05/2021   HGB 14.2 04/05/2021   HCT 41.5 04/05/2021   MCV 91 04/05/2021   MCH 31.1 04/05/2021   RDW 12.1 04/05/2021   PLT 185 94/76/5465   Last metabolic panel Lab Results  Component Value Date   GLUCOSE 90 04/05/2021   NA 142 04/05/2021   K 4.1 04/05/2021   CL 103 04/05/2021   CO2 25 04/05/2021   BUN 16 04/05/2021   CREATININE 0.61 04/05/2021   EGFR 99 04/05/2021   CALCIUM 9.2 04/05/2021   PROT 6.6 04/05/2021   ALBUMIN 4.3 04/05/2021   LABGLOB 2.3 04/05/2021   AGRATIO 1.9 04/05/2021   BILITOT 0.8 04/05/2021   ALKPHOS 126 (H) 04/05/2021   AST 23 04/05/2021   ALT 14  04/05/2021   ANIONGAP 7 12/27/2019   Last lipids Lab Results  Component Value Date   CHOL 178 04/05/2021   HDL 48 04/05/2021   LDLCALC 99 04/05/2021   TRIG 178 (H) 04/05/2021   CHOLHDL 3.7 04/05/2021   Last hemoglobin A1c Lab Results  Component Value Date   HGBA1C 5.6 04/05/2021   Last thyroid functions Lab Results  Component Value Date   TSH 0.708 04/05/2021   T4TOTAL 7.2 09/19/2014   Last vitamin D Lab Results  Component Value Date   VD25OH 29.7 (L) 04/05/2021     Objective    BP 102/68   Pulse 90   Temp 97.7 F (36.5 C)   Ht 5' 4" (1.626 m)   Wt 141 lb (64 kg)   SpO2 99%   BMI 24.20 kg/m  BP Readings from Last 3 Encounters:  10/17/21 102/68  04/05/21 118/71  02/22/21 112/67   Wt Readings from Last 3 Encounters:  10/17/21 141 lb (64 kg)  04/05/21 142 lb (64.4 kg)  02/22/21 142 lb (64.4 kg)    Physical Exam  General:  Well Developed, well nourished, appropriate for stated age.  Neuro:  Alert and oriented,  extra-ocular muscles intact  HEENT:  Normocephalic, atraumatic, neck supple  Skin:  erythematous and scaly lesion noted over nuchal ridge Cardiac:  RRR, S1 S2 Respiratory: CTA B/L  Extremities:  several toes including  great toes with yellow discoloration and thickening. Psych:  No HI/SI, judgement and insight good, Euthymic mood. Full Affect.   No results found for any visits on 10/17/21.  Assessment & Plan      Problem List Items Addressed This Visit       Cardiovascular and Mediastinum   Paroxysmal SVT (supraventricular tachycardia) ([place]) - Primary    -Stable. Continue current medication regimen. Will continue to monitor.        Musculoskeletal and Integument   Onychomycosis    -Patient has tried and failed topical treatment with ciclopirox cream. Will start oral terbinafine 250 mg daily x 12 weeks. Will obtain CMP for baseline liver function and repeat in 6 weeks.       Relevant Medications   terbinafine (LAMISIL) 250 MG tablet    Other Relevant Orders   Comp Met (CMET)     Other   Elevated LDL cholesterol level    -Last lipid panel LDL 99. Patient non-fasting today. Will repeat lipid panel with annual physical. Recommend to continue with a low fat diet. -The 10-year ASCVD risk score (Arnett DK, et al., 2019) is: 4%       Other Visit Diagnoses     Rash and nonspecific skin eruption       Relevant Medications   clobetasol (TEMOVATE) 0.05 % external solution       Rash and nonspecific skin eruption: -Discussed with patient skin lesion possibly dermatitis or psoriasis. Recommend to apply topical steroid scalp solution BID as needed for rash to improve. Follow-up if rash fails to improve or worsen.   Return in about 6 months (around 04/18/2022) for CPE (not MCW)  and FBW; lab visit in 6 weeks for hepatic function.        Lorrene Reid, PA-C  [facility] [phone] (phone) [phone] (fax)  [place]

## 2021-10-17 NOTE — Assessment & Plan Note (Signed)
-  Patient has tried and failed topical treatment with ciclopirox cream. Will start oral terbinafine 250 mg daily x 12 weeks. Will obtain CMP for baseline liver function and repeat in 6 weeks.

## 2021-10-18 LAB — COMPREHENSIVE METABOLIC PANEL
ALT: 19 IU/L (ref 0–32)
AST: 26 IU/L (ref 0–40)
Albumin/Globulin Ratio: 2.1 (ref 1.2–2.2)
Albumin: 4.4 g/dL (ref 3.8–4.8)
Alkaline Phosphatase: 117 IU/L (ref 44–121)
BUN/Creatinine Ratio: 32 — ABNORMAL HIGH (ref 12–28)
BUN: 20 mg/dL (ref 8–27)
Bilirubin Total: 0.6 mg/dL (ref 0.0–1.2)
CO2: 25 mmol/L (ref 20–29)
Calcium: 9.3 mg/dL (ref 8.7–10.3)
Chloride: 104 mmol/L (ref 96–106)
Creatinine, Ser: 0.63 mg/dL (ref 0.57–1.00)
Globulin, Total: 2.1 g/dL (ref 1.5–4.5)
Glucose: 81 mg/dL (ref 70–99)
Potassium: 4.3 mmol/L (ref 3.5–5.2)
Sodium: 142 mmol/L (ref 134–144)
Total Protein: 6.5 g/dL (ref 6.0–8.5)
eGFR: 98 mL/min/{1.73_m2} (ref >59)

## 2021-11-08 ENCOUNTER — Other Ambulatory Visit: Payer: Self-pay | Admitting: Physician Assistant

## 2021-11-08 DIAGNOSIS — I471 Supraventricular tachycardia: Secondary | ICD-10-CM

## 2021-11-27 ENCOUNTER — Other Ambulatory Visit: Payer: Self-pay

## 2021-11-27 DIAGNOSIS — R748 Abnormal levels of other serum enzymes: Secondary | ICD-10-CM

## 2021-11-27 DIAGNOSIS — Z0000 Encounter for general adult medical examination without abnormal findings: Secondary | ICD-10-CM

## 2021-11-28 ENCOUNTER — Other Ambulatory Visit: Payer: BC Managed Care – PPO

## 2021-11-28 DIAGNOSIS — R748 Abnormal levels of other serum enzymes: Secondary | ICD-10-CM

## 2021-11-28 DIAGNOSIS — Z0000 Encounter for general adult medical examination without abnormal findings: Secondary | ICD-10-CM

## 2021-11-29 LAB — COMPREHENSIVE METABOLIC PANEL
ALT: 16 IU/L (ref 0–32)
AST: 20 IU/L (ref 0–40)
Albumin/Globulin Ratio: 2.2 (ref 1.2–2.2)
Albumin: 4.4 g/dL (ref 3.9–4.9)
Alkaline Phosphatase: 116 IU/L (ref 44–121)
BUN/Creatinine Ratio: 22 (ref 12–28)
BUN: 14 mg/dL (ref 8–27)
Bilirubin Total: 0.5 mg/dL (ref 0.0–1.2)
CO2: 22 mmol/L (ref 20–29)
Calcium: 9.2 mg/dL (ref 8.7–10.3)
Chloride: 105 mmol/L (ref 96–106)
Creatinine, Ser: 0.63 mg/dL (ref 0.57–1.00)
Globulin, Total: 2 g/dL (ref 1.5–4.5)
Glucose: 92 mg/dL (ref 70–99)
Potassium: 4.1 mmol/L (ref 3.5–5.2)
Sodium: 142 mmol/L (ref 134–144)
Total Protein: 6.4 g/dL (ref 6.0–8.5)
eGFR: 98 mL/min/{1.73_m2} (ref >59)

## 2021-11-29 LAB — CBC WITH DIFFERENTIAL/PLATELET
Basophils Absolute: 0 10*3/uL (ref 0.0–0.2)
Basos: 1 %
EOS (ABSOLUTE): 0.2 10*3/uL (ref 0.0–0.4)
Eos: 4 %
Hematocrit: 39.5 % (ref 34.0–46.6)
Hemoglobin: 13.6 g/dL (ref 11.1–15.9)
Immature Grans (Abs): 0 10*3/uL (ref 0.0–0.1)
Immature Granulocytes: 0 %
Lymphocytes Absolute: 1.8 10*3/uL (ref 0.7–3.1)
Lymphs: 38 %
MCH: 31.6 pg (ref 26.6–33.0)
MCHC: 34.4 g/dL (ref 31.5–35.7)
MCV: 92 fL (ref 79–97)
Monocytes Absolute: 0.4 10*3/uL (ref 0.1–0.9)
Monocytes: 9 %
Neutrophils Absolute: 2.3 10*3/uL (ref 1.4–7.0)
Neutrophils: 48 %
Platelets: 174 10*3/uL (ref 150–450)
RBC: 4.3 x10E6/uL (ref 3.77–5.28)
RDW: 12.4 % (ref 11.7–15.4)
WBC: 4.7 10*3/uL (ref 3.4–10.8)

## 2021-11-29 LAB — HEMOGLOBIN A1C
Est. average glucose Bld gHb Est-mCnc: 108 mg/dL
Hgb A1c MFr Bld: 5.4 % (ref 4.8–5.6)

## 2021-11-29 LAB — LIPID PANEL
Chol/HDL Ratio: 3.2 ratio (ref 0.0–4.4)
Cholesterol, Total: 164 mg/dL (ref 100–199)
HDL: 51 mg/dL (ref >39)
LDL Chol Calc (NIH): 98 mg/dL (ref 0–99)
Triglycerides: 78 mg/dL (ref 0–149)
VLDL Cholesterol Cal: 15 mg/dL (ref 5–40)

## 2021-11-29 LAB — HEPATIC FUNCTION PANEL: Bilirubin, Direct: 0.14 mg/dL (ref 0.00–0.40)

## 2021-11-29 LAB — TSH: TSH: 0.792 u[IU]/mL (ref 0.450–4.500)

## 2021-12-29 ENCOUNTER — Encounter: Payer: Self-pay | Admitting: Physician Assistant

## 2021-12-30 ENCOUNTER — Other Ambulatory Visit: Payer: Self-pay | Admitting: Nurse Practitioner

## 2021-12-30 DIAGNOSIS — B351 Tinea unguium: Secondary | ICD-10-CM

## 2021-12-31 ENCOUNTER — Other Ambulatory Visit: Payer: Self-pay | Admitting: Nurse Practitioner

## 2021-12-31 DIAGNOSIS — B351 Tinea unguium: Secondary | ICD-10-CM

## 2021-12-31 MED ORDER — TERBINAFINE HCL 250 MG PO TABS: 250.0000 mg | ORAL_TABLET | Freq: Every day | ORAL | 1 refills | Status: DC

## 2021-12-31 MED ORDER — CICLOPIROX OLAMINE 0.77 % EX CREA: TOPICAL_CREAM | Freq: Two times a day (BID) | CUTANEOUS | 1 refills | Status: AC

## 2022-01-22 ENCOUNTER — Other Ambulatory Visit: Payer: Self-pay | Admitting: Physician Assistant

## 2022-01-22 DIAGNOSIS — I471 Supraventricular tachycardia, unspecified: Secondary | ICD-10-CM

## 2022-03-27 IMAGING — CR DG SHOULDER 2+V*L*
2 series · 2 of 2 positions shown · non-contrast
Comparison: None.

CLINICAL DATA: Pain post fall.

EXAM:
LEFT SHOULDER - 2+ VIEW

[x shoulder ap left (1 of 2)]
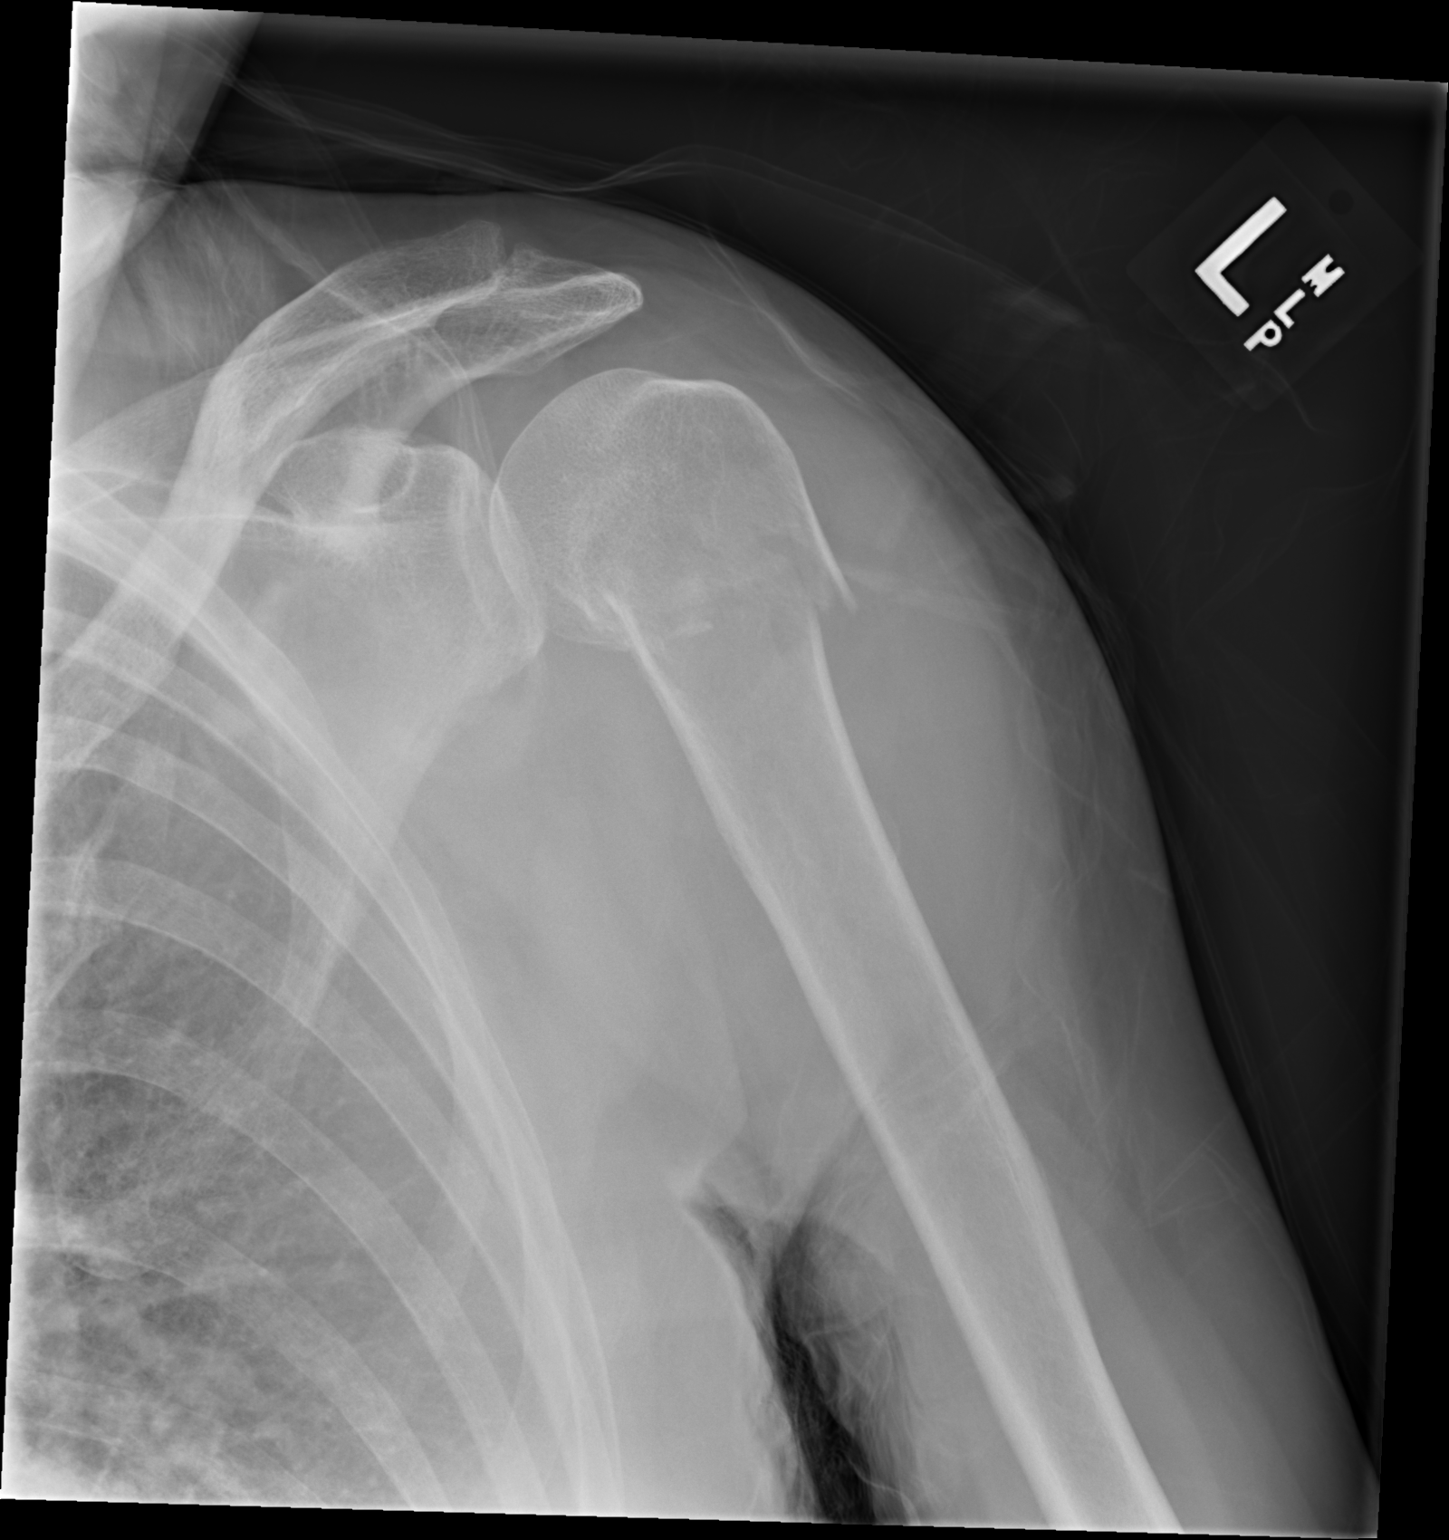

[x shoulder ap left (2 of 2)]
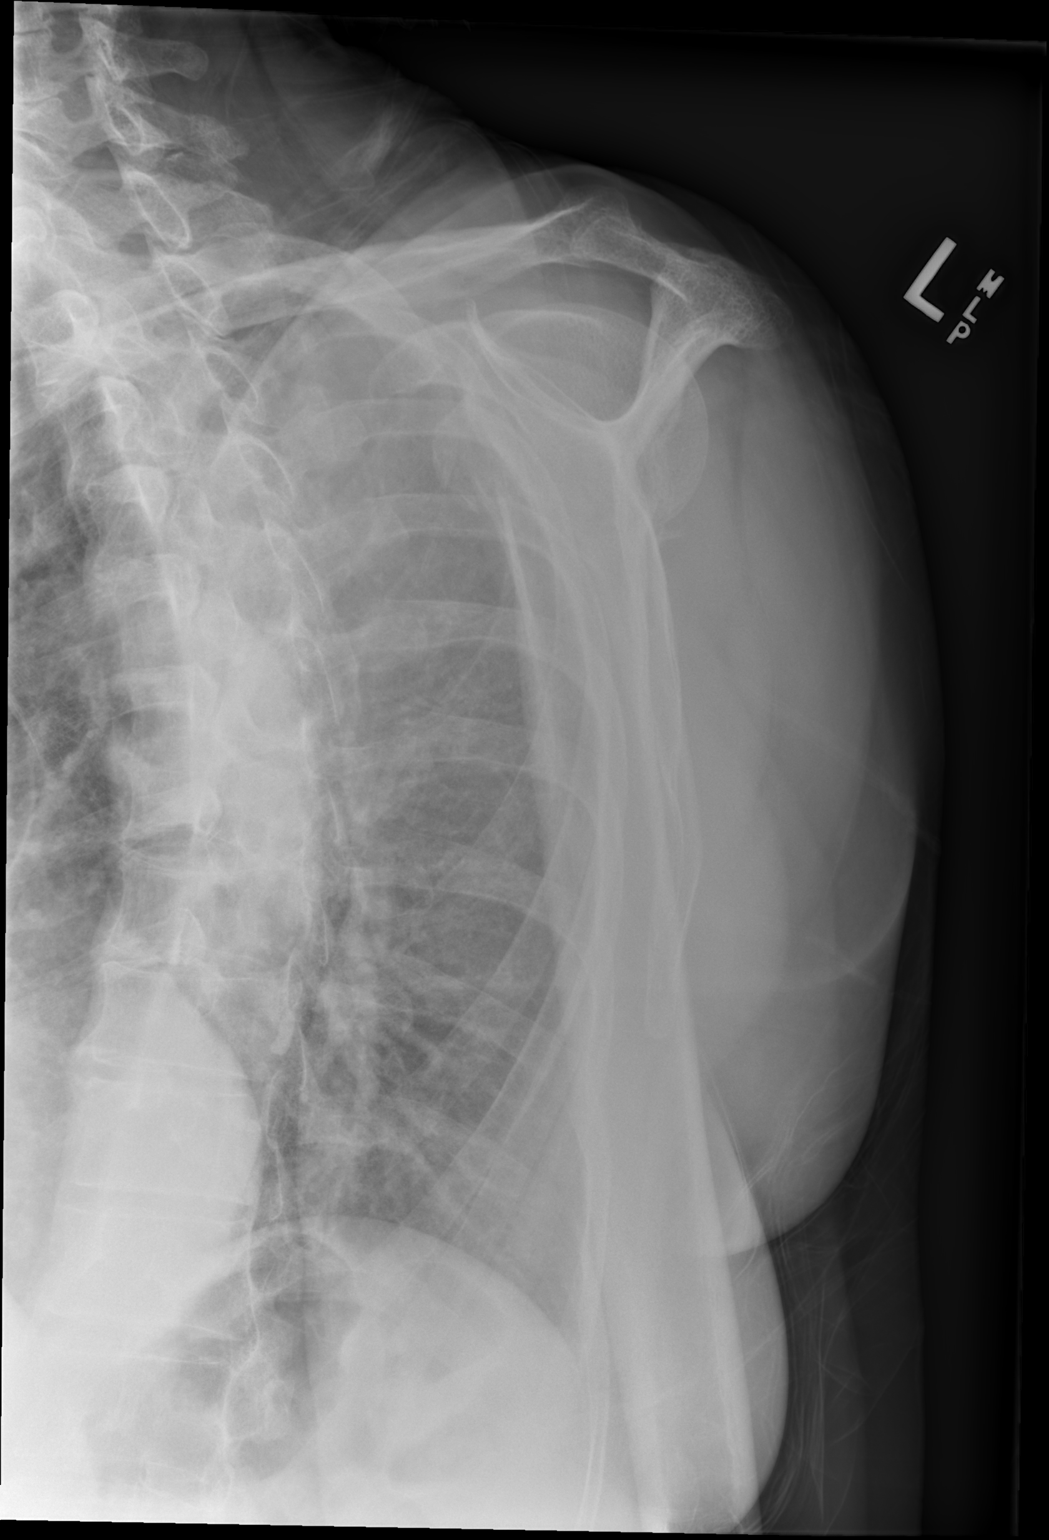

[2 of 2 positions shown; findings below may reference images not displayed]

FINDINGS: There is a transverse impacted mildly comminuted fracture of the
left humeral neck. There is no evidence of dislocation at the
glenohumeral joint. Mild overlying soft tissue swelling.
IMPRESSION: Transverse impacted mildly comminuted fracture of the left humeral
neck.

## 2022-03-27 IMAGING — CR DG HIP (WITH OR WITHOUT PELVIS) 2-3V*L*
3 series · 3 of 3 positions shown · non-contrast
Comparison: None.

CLINICAL DATA: Left hip pain post fall.

EXAM:
DG HIP (WITH OR WITHOUT PELVIS) 2-3V LEFT

[x pelvis]
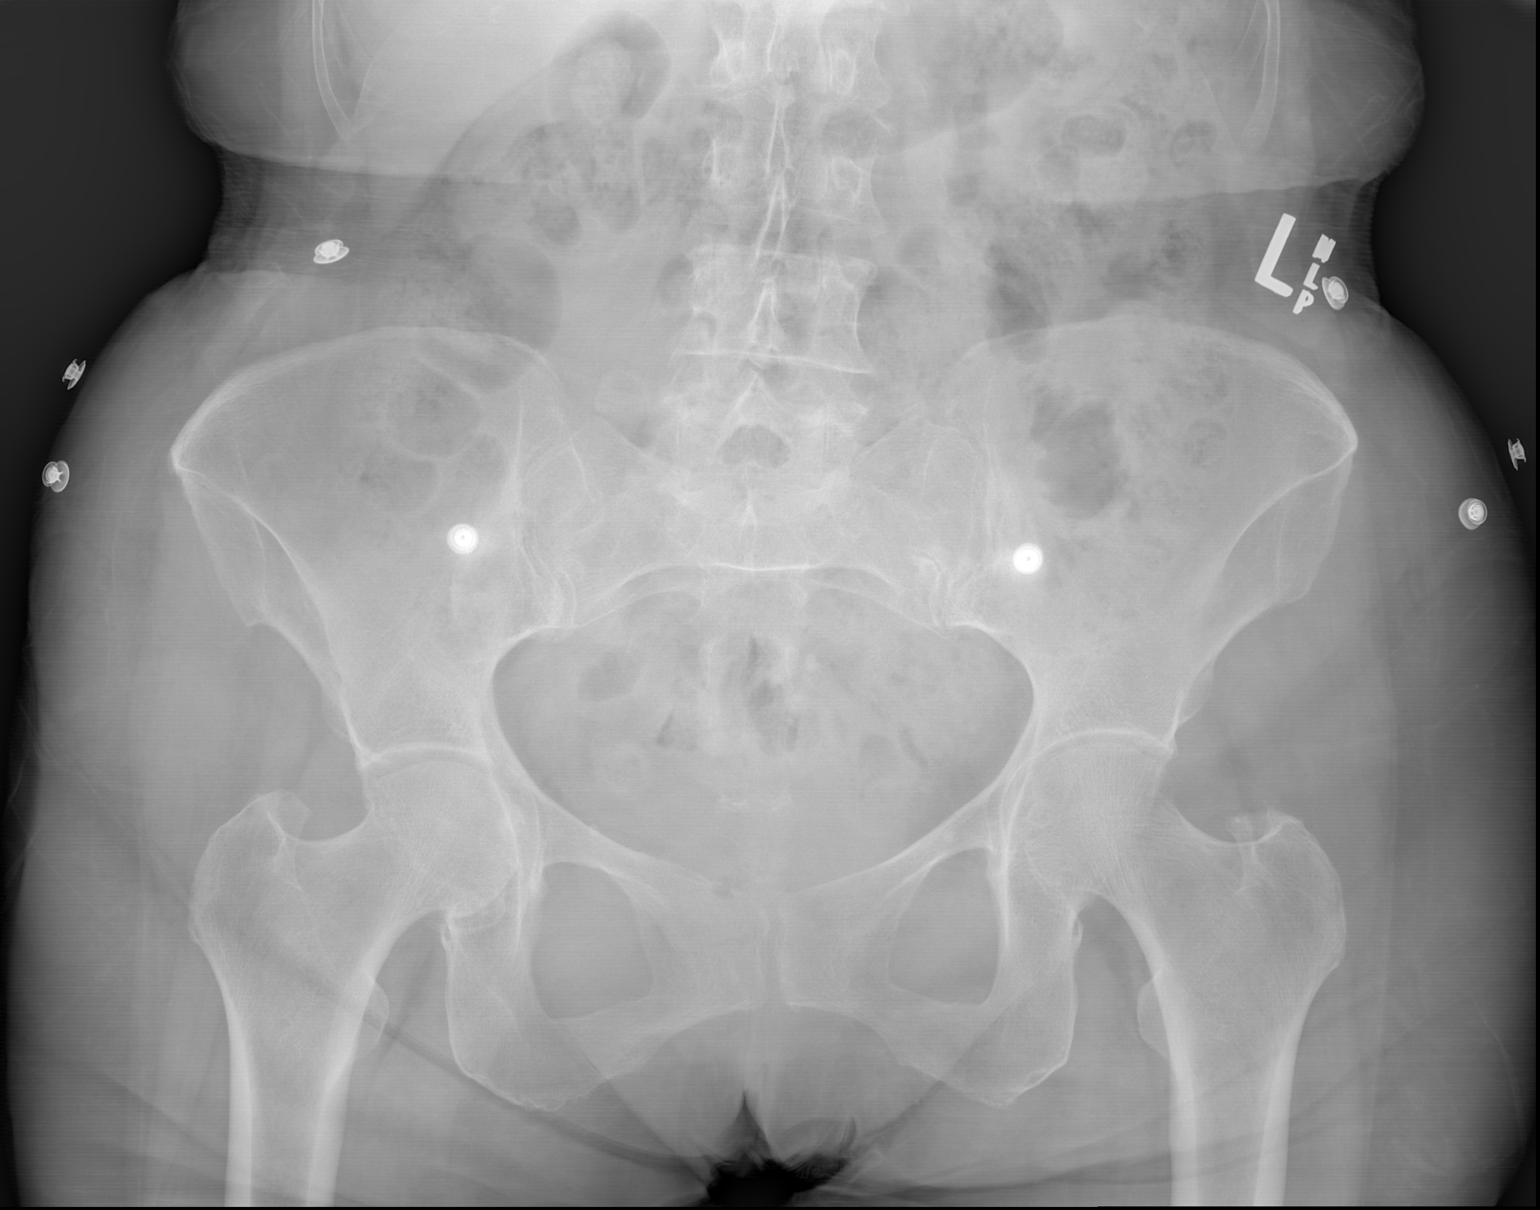

[x hip ap left]
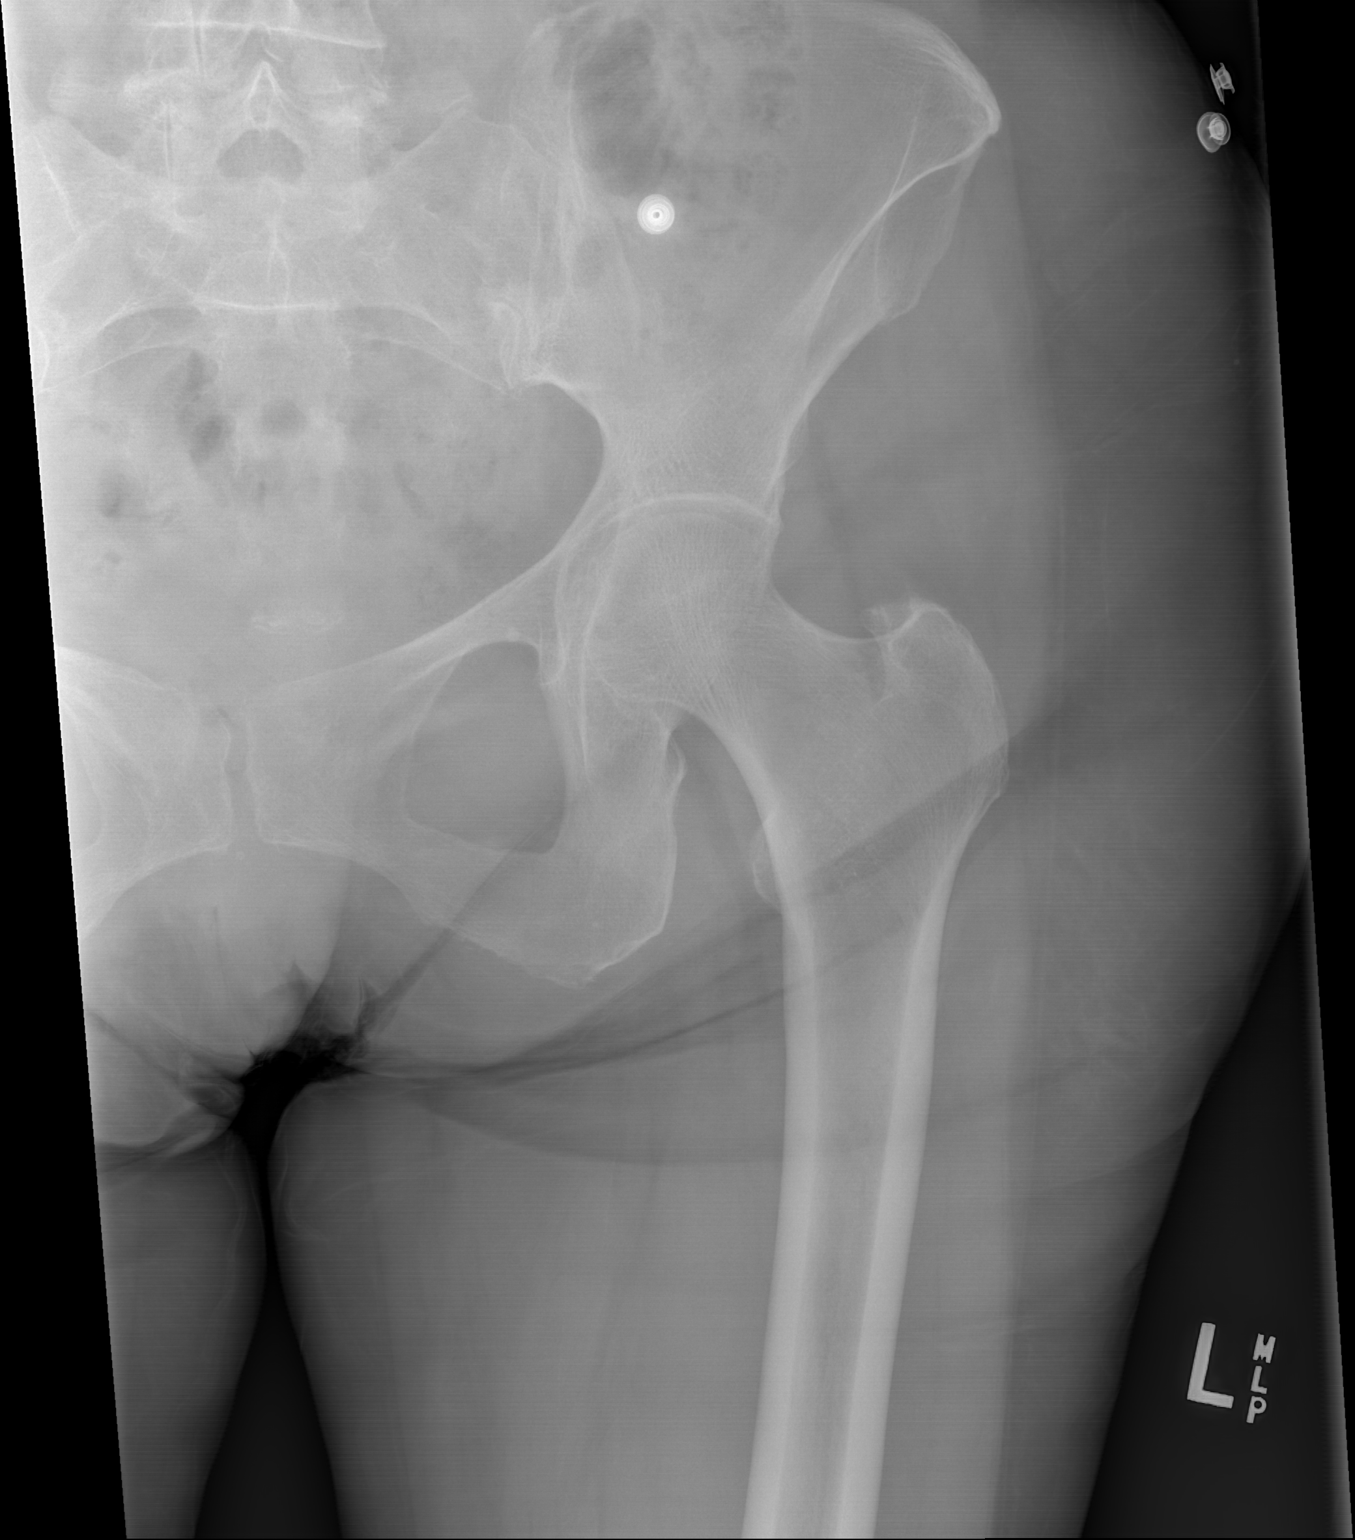

[x hip lat left]
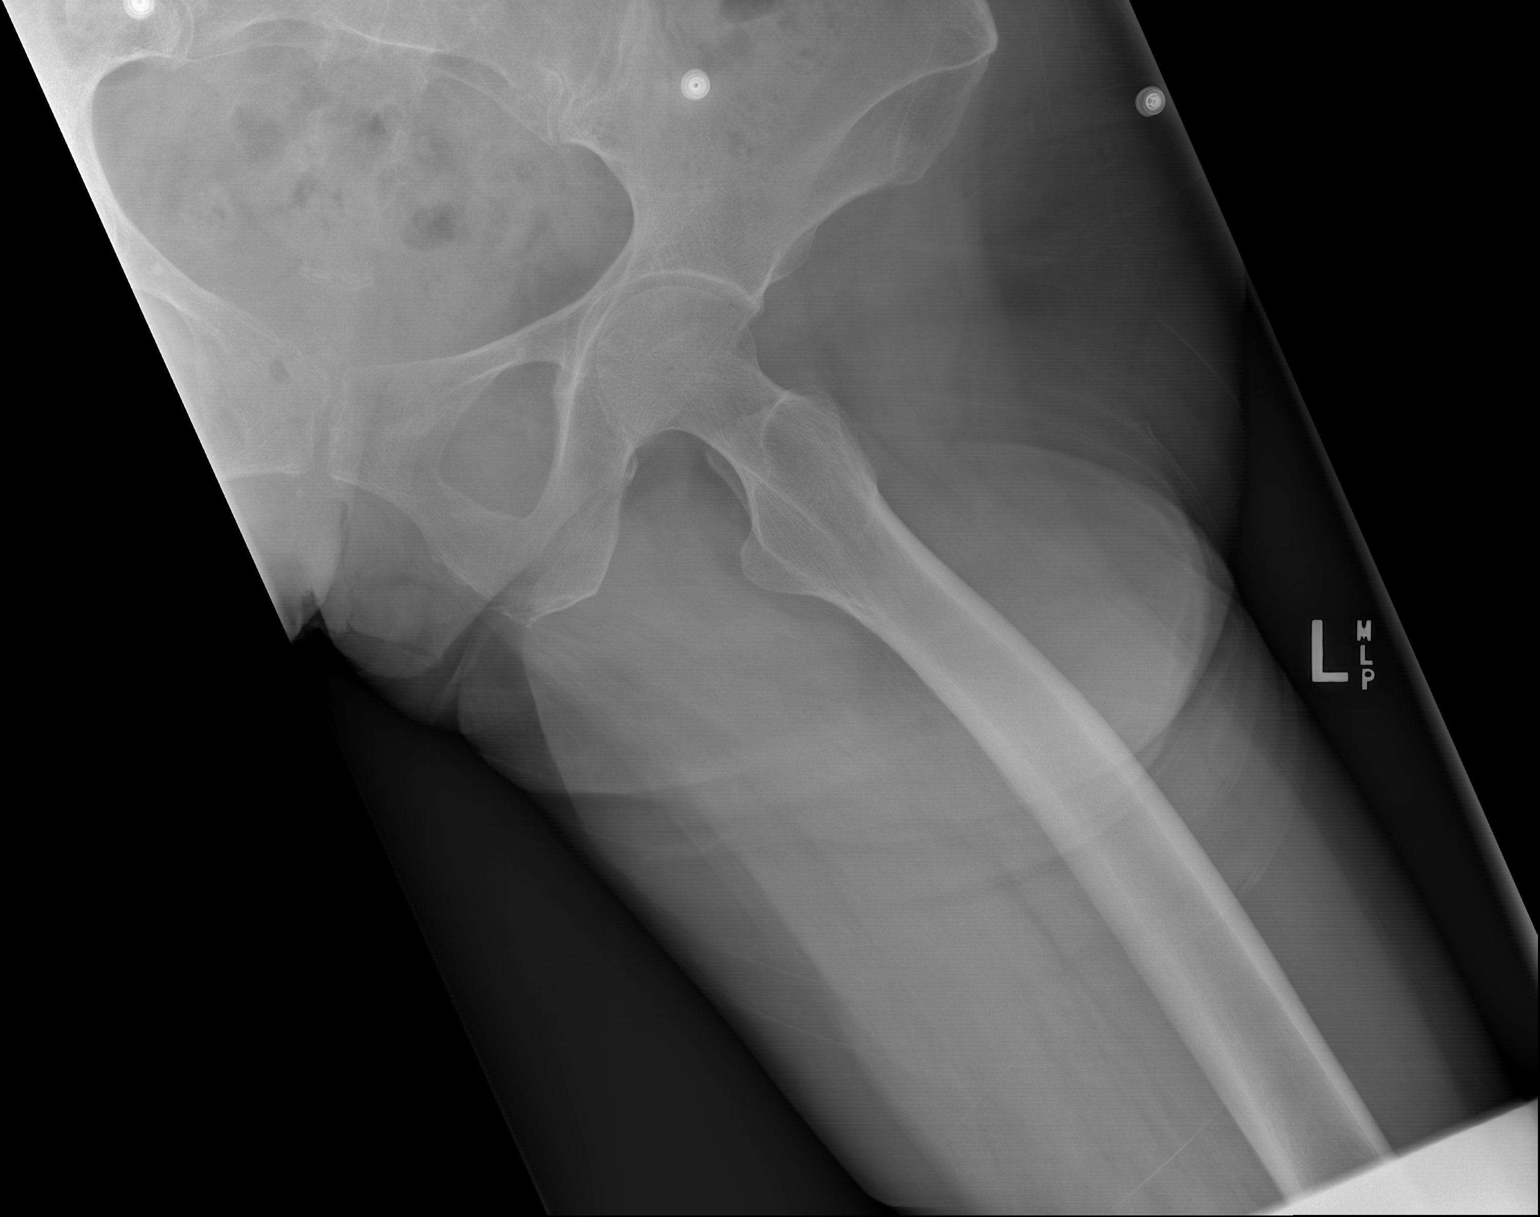

[3 of 3 positions shown; findings below may reference images not displayed]

FINDINGS: There is no evidence of hip fracture or dislocation. There is no
evidence of arthropathy or other focal bone abnormality. Soft tissue
bruising overlying the proximal left femur.
IMPRESSION: 1. No acute fracture or dislocation identified about the left hip.
2. Soft tissue bruising overlying the proximal left femur.

## 2022-03-28 IMAGING — DX DG CHEST 1V PORT
1 series · 1 of 1 positions shown · non-contrast
Comparison: February 11, 2019 chest radiograph; left shoulder
radiographs December 26, 2019

CLINICAL DATA: Fever

EXAM:
PORTABLE CHEST 1 VIEW

[chest]
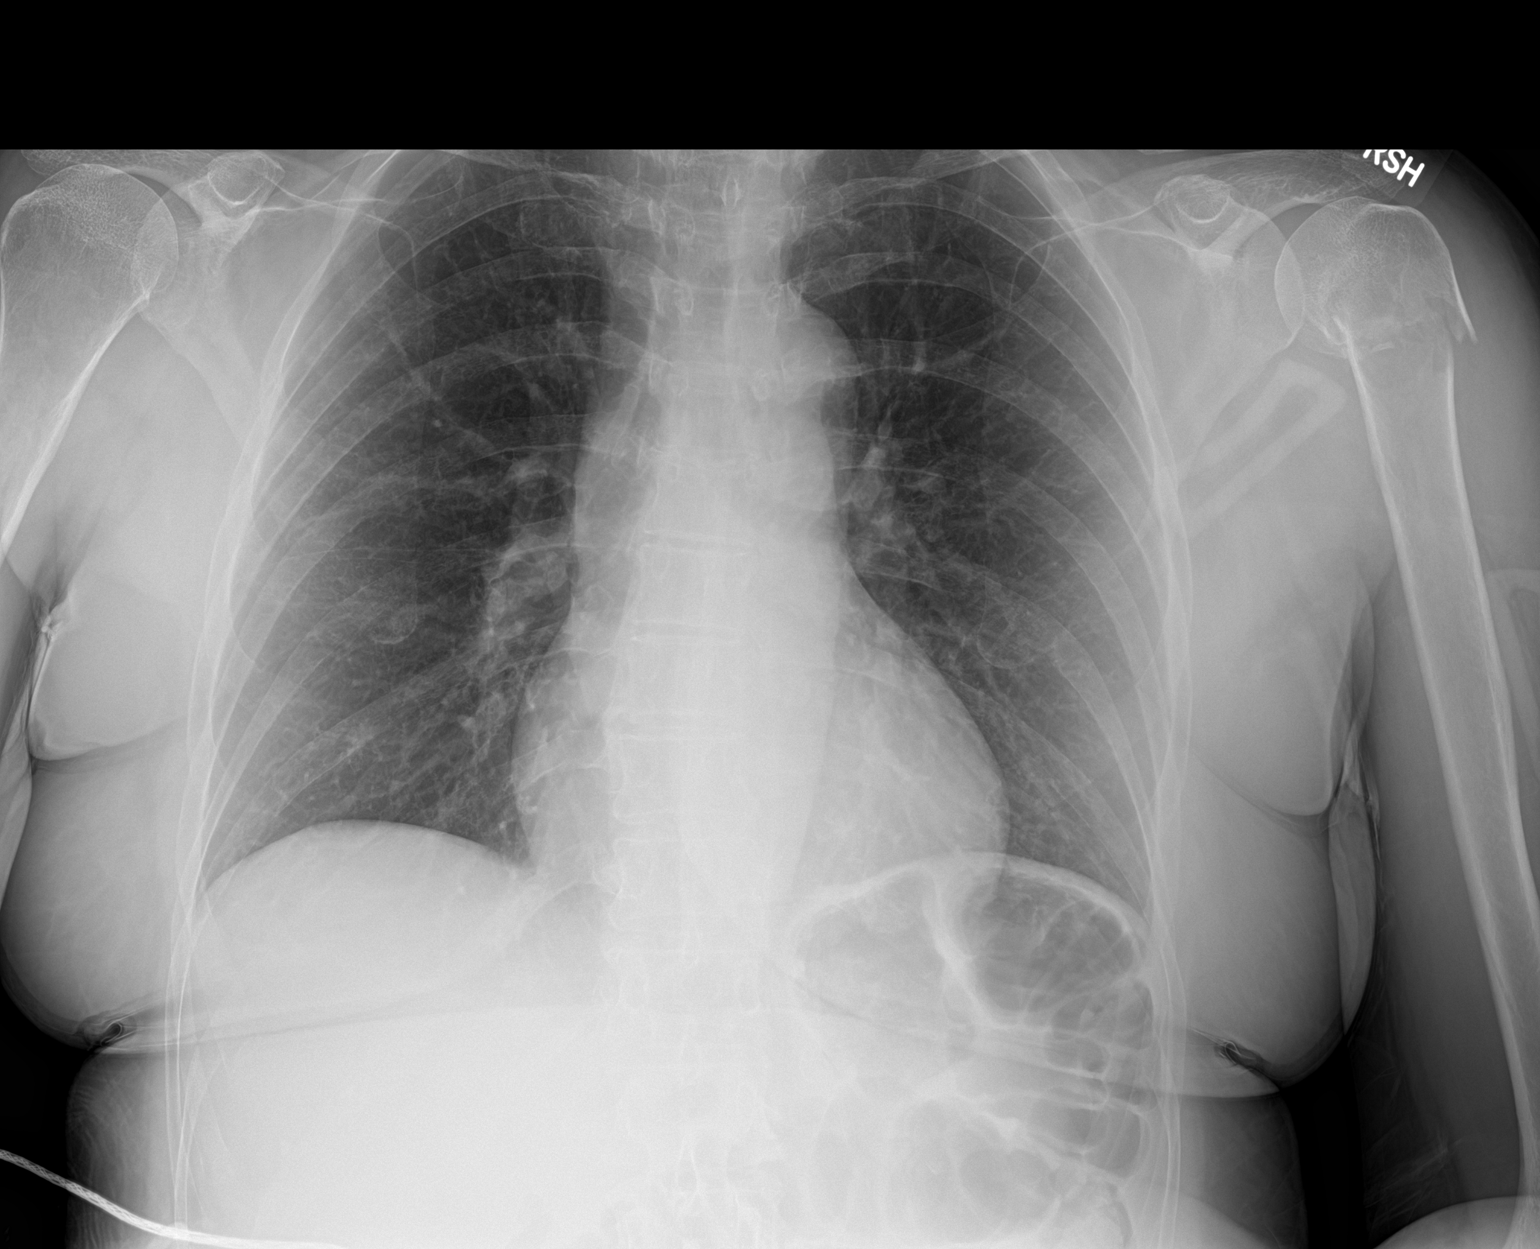

[1 of 1 positions shown; findings below may reference images not displayed]

FINDINGS: No appreciable edema or airspace opacity. Heart size and pulmonary
vascularity are normal. No adenopathy.

There is evidence of a fracture of the proximal left humeral
metaphysis with slight impaction at the fracture site. No
pneumothorax.
IMPRESSION: Fracture proximal left humerus, noted on recent shoulder
radiographs. Lungs clear. No pneumothorax. Cardiac silhouette within
normal limits.

## 2022-04-17 ENCOUNTER — Encounter: Payer: BC Managed Care – PPO | Admitting: Physician Assistant

## 2022-05-01 ENCOUNTER — Telehealth: Payer: Self-pay | Admitting: *Deleted

## 2022-05-01 DIAGNOSIS — I471 Supraventricular tachycardia, unspecified: Secondary | ICD-10-CM

## 2022-05-01 MED ORDER — METOPROLOL SUCCINATE ER 25 MG PO TB24: 25.0000 mg | ORAL_TABLET | Freq: Every day | ORAL | 0 refills | Status: DC

## 2022-05-01 NOTE — Telephone Encounter (Signed)
Refill sent.

## 2022-05-01 NOTE — Telephone Encounter (Signed)
Pt calling requesting refill on below. Rubena Roseman Zimmerman Rumple, CMA    metoprolol succinate (TOPROL-XL) 25 MG 24 hr tablet      [facility] [place] to Registered [facility] Sites   Pt has appointment on 06/06/22

## 2022-06-06 ENCOUNTER — Ambulatory Visit (INDEPENDENT_AMBULATORY_CARE_PROVIDER_SITE_OTHER): Payer: BC Managed Care – PPO | Admitting: Family Medicine

## 2022-06-06 ENCOUNTER — Other Ambulatory Visit: Payer: Self-pay | Admitting: Family Medicine

## 2022-06-06 ENCOUNTER — Encounter: Payer: Self-pay | Admitting: Family Medicine

## 2022-06-06 VITALS — BP 120/76 | HR 61 | Resp 18 | Ht 64.0 in | Wt 142.0 lb

## 2022-06-06 DIAGNOSIS — B351 Tinea unguium: Secondary | ICD-10-CM

## 2022-06-06 DIAGNOSIS — I471 Supraventricular tachycardia, unspecified: Secondary | ICD-10-CM | POA: Diagnosis not present

## 2022-06-06 DIAGNOSIS — E7800 Pure hypercholesterolemia, unspecified: Secondary | ICD-10-CM

## 2022-06-06 DIAGNOSIS — Z0000 Encounter for general adult medical examination without abnormal findings: Secondary | ICD-10-CM | POA: Diagnosis not present

## 2022-06-06 DIAGNOSIS — Z789 Other specified health status: Secondary | ICD-10-CM

## 2022-06-06 DIAGNOSIS — K644 Residual hemorrhoidal skin tags: Secondary | ICD-10-CM

## 2022-06-06 DIAGNOSIS — R638 Other symptoms and signs concerning food and fluid intake: Secondary | ICD-10-CM

## 2022-06-06 DIAGNOSIS — E559 Vitamin D deficiency, unspecified: Secondary | ICD-10-CM

## 2022-06-06 MED ORDER — TERBINAFINE HCL 250 MG PO TABS: 250.0000 mg | ORAL_TABLET | Freq: Every day | ORAL | 1 refills | Status: DC

## 2022-06-06 NOTE — Progress Notes (Signed)
Subjective:   Veronica Ray is a 67 y.o. female who presents for Medicare Annual (Subsequent) preventive examination.  Overall she is doing well.  She does eat a vegetarian diet prioritizing fruits and vegetables as well as protein sources such as tofu and legumes.  She does want to make sure that she is getting proper nutrition since she is following a vegetarian diet.  She exercises using a rowing machine and also walks 3 to 3-1/2 miles at least once a week.  She has a history of external hemorrhoids that continue to act up from time to time.  She has been using over-the-counter creams and making an effort to drink lots of water and eat fiber to keep stools soft.  Toenail fungus did improve after a course of Lamisil for about 2 months, however it is still present.  Has remained stable at its improved state since finishing the Lamisil, but she would like it to be gone completely.  Review of Systems    Review of Systems  Gastrointestinal:  Positive for blood in stool and constipation.  Musculoskeletal:  Positive for joint pain (Right hip after walking long distances).  Skin:  Positive for rash (toenail fungus).        Objective:    Today's Vitals   06/06/22 0826  BP: 120/76  Pulse: 61  Resp: 18  SpO2: 99%  Weight: 142 lb (64.4 kg)  Height: 5' 4"$  (1.626 m)   Body mass index is 24.37 kg/m.     12/26/2019   10:00 PM 12/26/2019   10:07 AM  Advanced Directives  Does Patient Have a Medical Advance Directive? Yes Yes  Type of Advance Directive Living will [place];Living will  Does patient want to make changes to medical advance directive? No - Patient declined     Current Medications (verified) Outpatient Encounter Medications as of 06/06/2022  Medication Sig   ciclopirox (LOPROX) 0.77 % cream Apply topically 2 (two) times daily.   clobetasol (TEMOVATE) 0.05 % external solution Apply 1 Application topically 2 (two) times daily.   metoprolol succinate  (TOPROL-XL) 25 MG 24 hr tablet Take 1 tablet (25 mg total) by mouth daily.   Multiple Vitamins-Minerals (MULTIVITAMIN WITH MINERALS) tablet Take 0.5 tablets by mouth daily.   triamcinolone (NASACORT) 55 MCG/ACT AERO nasal inhaler Place 1 spray into the nose 2 (two) times daily.   terbinafine (LAMISIL) 250 MG tablet Take 1 tablet (250 mg total) by mouth daily. (Patient not taking: Reported on 06/06/2022)   No facility-administered encounter medications on file as of 06/06/2022.    Allergies (verified) Patient has no known allergies.   History: Past Medical History:  Diagnosis Date   Allergy    SVT (supraventricular tachycardia)    Past Surgical History:  Procedure Laterality Date   CESAREAN SECTION     Family History  Problem Relation Age of Onset   Cancer Paternal Grandmother    Cancer Paternal Grandfather    Social History   Socioeconomic History   Marital status: Married    Spouse name: Not on file   Number of children: Not on file   Years of education: Not on file   Highest education level: Not on file  Occupational History   Not on file  Tobacco Use   Smoking status: Never    Passive exposure: Never   Smokeless tobacco: Never  Vaping Use   Vaping Use: Never used  Substance and Sexual Activity   Alcohol use: No   Drug  use: No   Sexual activity: Not on file  Other Topics Concern   Not on file  Social History Narrative   Not on file   Social Determinants of Health   Financial Resource Strain: Low Risk  (06/06/2022)   Overall Financial Resource Strain (CARDIA)    Difficulty of Paying Living Expenses: Not hard at all  Food Insecurity: No Food Insecurity (06/06/2022)   Hunger Vital Sign    Worried About Running Out of Food in the Last Year: Never true    Ran Out of Food in the Last Year: Never true  Transportation Needs: No Transportation Needs (06/06/2022)   PRAPARE - Hydrologist (Medical): No    Lack of Transportation  (Non-Medical): No  Physical Activity: Sufficiently Active (06/06/2022)   Exercise Vital Sign    Days of Exercise per Week: 7 days    Minutes of Exercise per Session: 60 min  Stress: No Stress Concern Present (06/06/2022)   [place]    Feeling of Stress : Not at all  Social Connections: Moderately Isolated (06/06/2022)   Social Connection and Isolation Panel [NHANES]    Frequency of Communication with Friends and Family: More than three times a week    Frequency of Social Gatherings with Friends and Family: More than three times a week    Attends Religious Services: Never    Marine scientist or Organizations: No    Attends Music therapist: Never    Marital Status: Married    Tobacco Counseling Counseling given: Not Answered   Clinical Intake:  Pre-visit preparation completed: Yes  Pain : No/denies pain     BMI - recorded: 24.4 Nutritional Status: BMI of 19-24  Normal Nutritional Risks: None Diabetes: No  How often do you need to have someone help you when you read instructions, pamphlets, or other written materials from your doctor or pharmacy?: 1 - Never  Diabetic? No  Interpreter Needed?: No  Information entered by :: Veronica Ray, RMA   Activities of Daily Living    06/06/2022    8:59 AM 06/05/2022   10:15 PM  In your present state of health, do you have any difficulty performing the following activities:  Hearing? 0 0  Vision? 0 0  Difficulty concentrating or making decisions? 0 0  Walking or climbing stairs? 0 0  Dressing or bathing? 0 0  Doing errands, shopping? 0 0  Preparing Food and eating ?  N  Using the Toilet?  N  In the past six months, have you accidently leaked urine?  Y  Do you have problems with loss of bowel control?  N  Managing your Medications?  N  Managing your Finances?  N  Housekeeping or managing your Housekeeping?  N    Patient Care  Team: Veronica Harman, PA as PCP - General (Family Medicine)  Indicate any recent Medical Services you may have received from other than [facility] providers in the past year (date may be approximate).     Assessment:   This is a routine wellness examination for Veronica Ray.  Hearing/Vision screen Vision Screening - Comments:: Done by [facility].   Dietary issues and exercise activities discussed:     Goals Addressed               This Visit's Progress     Patient Stated (pt-stated)        She has a vegetarian  diet and would like to continue to follow this eating plan while making sure that she is getting all of the nutrients that she needs.  She has a good exercise routine and would like to continue that but is willing to make changes as needed as she would like to lose some of the abdominal fat that she has noticed has been accumulating.      Depression Screen    06/06/2022    8:59 AM 06/06/2022    8:58 AM 10/17/2021    3:51 PM 04/05/2021    8:12 AM 02/22/2021    9:03 AM 10/02/2020    9:15 AM 08/20/2020    8:43 AM  PHQ 2/9 Scores  PHQ - 2 Score 0 0 0 0 0 0 0  PHQ- 9 Score 0  0 0 1 0 0    Fall Risk    06/06/2022    8:58 AM 06/05/2022   10:15 PM 10/17/2021    3:51 PM 04/05/2021    8:11 AM 02/22/2021    9:03 AM  [place] in the past year? 1 1 0 0 0  Number falls in past yr: 0 0 0 0 1  Injury with Fall? 0 0 0 0   Risk for fall due to :   No Fall Risks No Fall Risks   Follow up   Falls evaluation completed Falls evaluation completed Falls evaluation completed    [place]:  Any stairs in or around the home? Yes  If so, are there any without handrails? Yes  Home free of loose throw rugs in walkways, pet beds, electrical cords, etc? Yes  Adequate lighting in your home to reduce risk of falls? Yes   ASSISTIVE DEVICES UTILIZED TO PREVENT FALLS:  Life alert? No  Use of a cane, walker or w/c? No  Grab bars in the bathroom?  Yes  Shower chair or bench in shower? No  Elevated toilet seat or a handicapped toilet? No   TIMED UP AND GO:  Was the test performed? Yes .  Length of time to ambulate 10 feet: 10-20 sec.   Gait steady and fast without use of assistive device  Cognitive Function:        06/06/2022    8:22 AM  6CIT Screen  What Year? 0 points  What month? 0 points  What time? 0 points  Count back from 20 0 points  Months in reverse 0 points  Repeat phrase 2 points  Total Score 2 points    Immunizations Immunization History  Administered Date(s) Administered   COVID-19, mRNA, vaccine(Comirnaty)12 years and older 04/05/2022   Fluad Quad(high Dose 65+) 03/23/2020, 02/22/2021   Influenza,inj,Quad PF,6+ Mos 03/13/2014, 05/01/2016, 07/02/2017, 04/28/2018, 01/18/2019   Influenza-Unspecified 04/05/2022   Moderna Sars-Covid-2 Vaccination 06/22/2019, 07/20/2019, 02/17/2020, 09/20/2020   PFIZER(Purple Top)SARS-COV-2 Vaccination 02/02/2021   Pfizer Covid-19 Vaccine Bivalent Booster 5y-11y 08/28/2021   Tdap 11/23/2015    TDAP status: Up to date  Flu Vaccine status: Due, Education has been provided regarding the importance of this vaccine. Advised may receive this vaccine at local pharmacy or Health Dept. Aware to provide a copy of the vaccination record if obtained from local pharmacy or Health Dept. Verbalized acceptance and understanding.  Pneumococcal vaccine status: Due, Education has been provided regarding the importance of this vaccine. Advised may receive this vaccine at local pharmacy or Health Dept. Aware to provide a copy of the vaccination record if obtained from local  pharmacy or Health Dept. Verbalized acceptance and understanding.  Covid-19 vaccine status: Information provided on how to obtain vaccines.   Qualifies for Shingles Vaccine? Yes   Zostavax completed No   Shingrix Completed?: No.    Education has been provided regarding the importance of this vaccine. Patient has been  advised to call insurance company to determine out of pocket expense if they have not yet received this vaccine. Advised may also receive vaccine at local pharmacy or Health Dept. Verbalized acceptance and understanding.  Screening Tests Health Maintenance  Topic Date Due   Hepatitis C Screening  Never done   Zoster Vaccines- Shingrix (1 of 2) Never done   Pneumonia Vaccine 47+ Years old (1 of 1 - PCV) Never done   DEXA SCAN  Never done   MAMMOGRAM  10/15/2023   DTaP/Tdap/Td (2 - Td or Tdap) 11/22/2025   COLONOSCOPY (Pts 45-79yr Insurance coverage will need to be confirmed)  05/01/2031   INFLUENZA VACCINE  Completed   COVID-19 Vaccine  Completed   HPV VACCINES  Aged Out    Health Maintenance  Health Maintenance Due  Topic Date Due   Hepatitis C Screening  Never done   Zoster Vaccines- Shingrix (1 of 2) Never done   Pneumonia Vaccine 68 Years old (1 of 1 - PCV) Never done   DEXA SCAN  Never done    Colorectal cancer screening: Type of screening: Cologuard. Completed 01/10202310. Repeat every 10 years  Mammogram status: Completed [id number] Repeat every year  Bone Density status: Completed  . Results reflect: Bone density results: NORMAL. Repeat every 5 years.  Lung Cancer Screening: (Low Dose CT Chest recommended if Age 68-80years, 30 pack-year currently smoking OR have quit w/in 15years.) does not qualify.     Additional Screening:  Hepatitis C Screening: does not qualify;   Vision Screening: Recommended annual ophthalmology exams for early detection of glaucoma and other disorders of the eye. Is the patient up to date with their annual eye exam?  Yes  Who is the provider or what is the name of the office in which the patient attends annual eye exams[facility] is not [facility] with a provider, would they like to be referred to a provider to establish care?  Established .   Dental Screening: Recommended annual dental exams for proper oral  hygiene  Community Resource Referral / Chronic Care Management: CRR required this visit?  No   CCM required this visit?  No      Plan:    Discussed screening recommendations and healthcare maintenance appropriate for her age. -Mammogram done in 2023, will repeat in 2025 -Cologuard 2023 was negative, repeat in 2026 -Referral sent for bone density scan -Recommended pneumonia and shingles vaccines, she will get both at her local pharmacy -Try Lamisil again for another month to see if there is any improvement in the toenail onychomycosis, if kidney function does not appear low on her CMP drawn today we will discontinue the Lamisil and come up with a new plan. -We discussed conservative options for the hip soreness that she has been experiencing including ice/heat, Tylenol, and modifying exercises as needed.  She will continue to monitor and we will reassess as needed if the pain does not improve or gets worse. -She will continue over-the-counter measures for hemorrhoids, opted not to have rectal exam today.  She is willing to try pelvic floor exercises to see if there is any improvement.  Provided education and patient will try  them at home, she will let me know if she does want a referral to a pelvic floor physical therapist. -Referral made to nutritionist for her to check in with them and see if she is adequately meeting her nutrition goals.  She would like to wait to see them until after the semester ends.  I have personally reviewed and noted the following in the patient's chart:   Medical and social history Use of alcohol, tobacco or illicit drugs  Current medications and supplements including opioid prescriptions. Patient is not currently taking opioid prescriptions. Functional ability and status Nutritional status Physical activity Advanced directives List of other physicians Hospitalizations, surgeries, and ER visits in previous 12 months Vitals Screenings to include cognitive,  depression, and falls Referrals and appointments  In addition, I have reviewed and discussed with patient certain preventive protocols, quality metrics, and best practice recommendations. A written personalized care plan for preventive services as well as general preventive health recommendations were provided to patient.     Veronica Harman, PA   06/06/2022   Nurse Notes: Face to face 20 min

## 2022-06-06 NOTE — Patient Instructions (Addendum)
Continue prioritizing fruits and veggies and plant-based sources of protein.  Keep up your exercise routine. You can get the pneumonia vaccine and shingles vaccine at your local pharmacy.  If you find the instructions for pelvic floor (Kegel) exercises difficult to follow, simply search for " pelvic floor physical therapy" online and there are a lot of different resources for you to try to see what works for you.  I am also sending a referral for you to have a bone density screening for osteoporosis which is recommended for females starting at age 22. They will call you to schedule whenever is a good time for you!

## 2022-06-06 NOTE — Assessment & Plan Note (Signed)
Stable. Continue current metoprolol regimen. Will continue to monitor, follow-up in 6 months.

## 2022-06-06 NOTE — Assessment & Plan Note (Signed)
Drawing vitamin D level today.  Currently takes a multivitamin, if vitamin D is low then may recommend an over-the-counter vitamin D supplement.

## 2022-06-07 LAB — COMPREHENSIVE METABOLIC PANEL
ALT: 16 IU/L (ref 0–32)
AST: 24 IU/L (ref 0–40)
Albumin/Globulin Ratio: 2.5 — ABNORMAL HIGH (ref 1.2–2.2)
Albumin: 4.5 g/dL (ref 3.9–4.9)
Alkaline Phosphatase: 118 IU/L (ref 44–121)
BUN/Creatinine Ratio: 22 (ref 12–28)
BUN: 15 mg/dL (ref 8–27)
Bilirubin Total: 0.6 mg/dL (ref 0.0–1.2)
CO2: 23 mmol/L (ref 20–29)
Calcium: 9.4 mg/dL (ref 8.7–10.3)
Chloride: 106 mmol/L (ref 96–106)
Creatinine, Ser: 0.67 mg/dL (ref 0.57–1.00)
Globulin, Total: 1.8 g/dL (ref 1.5–4.5)
Glucose: 97 mg/dL (ref 70–99)
Potassium: 4.5 mmol/L (ref 3.5–5.2)
Sodium: 143 mmol/L (ref 134–144)
Total Protein: 6.3 g/dL (ref 6.0–8.5)
eGFR: 96 mL/min/{1.73_m2} (ref >59)

## 2022-06-07 LAB — LIPID PANEL
Chol/HDL Ratio: 3 ratio (ref 0.0–4.4)
Cholesterol, Total: 160 mg/dL (ref 100–199)
HDL: 54 mg/dL (ref >39)
LDL Chol Calc (NIH): 91 mg/dL (ref 0–99)
Triglycerides: 77 mg/dL (ref 0–149)
VLDL Cholesterol Cal: 15 mg/dL (ref 5–40)

## 2022-06-07 LAB — CBC
Hematocrit: 40.5 % (ref 34.0–46.6)
Hemoglobin: 13.5 g/dL (ref 11.1–15.9)
MCH: 31.1 pg (ref 26.6–33.0)
MCHC: 33.3 g/dL (ref 31.5–35.7)
MCV: 93 fL (ref 79–97)
Platelets: 190 10*3/uL (ref 150–450)
RBC: 4.34 x10E6/uL (ref 3.77–5.28)
RDW: 12 % (ref 11.7–15.4)
WBC: 4.4 10*3/uL (ref 3.4–10.8)

## 2022-06-07 LAB — VITAMIN D 25 HYDROXY (VIT D DEFICIENCY, FRACTURES): Vit D, 25-Hydroxy: 34.3 ng/mL (ref 30.0–100.0)

## 2022-06-12 NOTE — Addendum Note (Signed)
Addended by: Virgil Benedict on: 06/12/2022 04:58 PM   Modules accepted: Orders

## 2022-06-13 ENCOUNTER — Encounter: Payer: Self-pay | Admitting: Family Medicine

## 2022-06-13 DIAGNOSIS — I471 Supraventricular tachycardia, unspecified: Secondary | ICD-10-CM

## 2022-06-13 DIAGNOSIS — B351 Tinea unguium: Secondary | ICD-10-CM

## 2022-06-13 MED ORDER — METOPROLOL SUCCINATE ER 25 MG PO TB24: 25.0000 mg | ORAL_TABLET | Freq: Every day | ORAL | 0 refills | Status: DC

## 2022-06-13 MED ORDER — TERBINAFINE HCL 250 MG PO TABS: 250.0000 mg | ORAL_TABLET | Freq: Every day | ORAL | 1 refills | Status: DC

## 2022-06-13 NOTE — Addendum Note (Signed)
Addended by: Gemma Payor on: 06/13/2022 11:49 AM   Modules accepted: Orders

## 2022-08-12 ENCOUNTER — Encounter: Payer: Self-pay | Admitting: Family Medicine

## 2022-08-12 ENCOUNTER — Ambulatory Visit
Admission: RE | Admit: 2022-08-12 | Discharge: 2022-08-12 | Disposition: A | Discharge status: Home / Self Care | Payer: BC Managed Care – PPO | Source: Ambulatory Visit | Attending: Family Medicine | Admitting: Family Medicine

## 2022-08-12 ENCOUNTER — Ambulatory Visit: Payer: BC Managed Care – PPO | Admitting: Family Medicine

## 2022-08-12 VITALS — BP 112/70 | HR 71 | Resp 18 | Ht 64.0 in | Wt 144.0 lb

## 2022-08-12 DIAGNOSIS — M25611 Stiffness of right shoulder, not elsewhere classified: Secondary | ICD-10-CM

## 2022-08-12 DIAGNOSIS — Z780 Asymptomatic menopausal state: Secondary | ICD-10-CM
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
## 2022-08-15 ENCOUNTER — Encounter: Payer: Self-pay | Admitting: Orthopaedic Surgery

## 2022-08-15 ENCOUNTER — Ambulatory Visit: Payer: BC Managed Care – PPO | Admitting: Orthopaedic Surgery

## 2022-08-15 DIAGNOSIS — G8929 Other chronic pain: Secondary | ICD-10-CM | POA: Diagnosis not present

## 2022-08-15 DIAGNOSIS — M25511 Pain in right shoulder: Secondary | ICD-10-CM

## 2022-08-15 MED ORDER — LIDOCAINE HCL 1 % IJ SOLN
3.0000 mL | INTRAMUSCULAR | Status: AC | PRN
  Administered 2022-08-15: 3 mL

## 2022-08-15 MED ORDER — BUPIVACAINE HCL 0.5 % IJ SOLN
3.0000 mL | INTRAMUSCULAR | Status: AC | PRN
  Administered 2022-08-15: 3 mL via INTRA_ARTICULAR

## 2022-08-15 MED ORDER — METHYLPREDNISOLONE ACETATE 40 MG/ML IJ SUSP
40.0000 mg | INTRAMUSCULAR | Status: AC | PRN
  Administered 2022-08-15: 40 mg via INTRA_ARTICULAR
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
## 2022-08-28 ENCOUNTER — Ambulatory Visit
Admission: RE | Admit: 2022-08-28 | Discharge: 2022-08-28 | Disposition: A | Discharge status: Home / Self Care | Payer: BC Managed Care – PPO | Source: Ambulatory Visit | Attending: Family Medicine | Admitting: Family Medicine

## 2022-08-28 DIAGNOSIS — Z780 Asymptomatic menopausal state: Secondary | ICD-10-CM

## 2022-09-22 ENCOUNTER — Other Ambulatory Visit: Payer: Self-pay | Admitting: Family Medicine

## 2022-09-22 DIAGNOSIS — I471 Supraventricular tachycardia, unspecified: Secondary | ICD-10-CM

## 2022-12-04 ENCOUNTER — Other Ambulatory Visit: Payer: Self-pay | Admitting: Family Medicine

## 2022-12-04 DIAGNOSIS — I471 Supraventricular tachycardia, unspecified: Secondary | ICD-10-CM

## 2022-12-05 ENCOUNTER — Encounter: Payer: Self-pay | Admitting: Family Medicine

## 2022-12-05 ENCOUNTER — Ambulatory Visit: Payer: BC Managed Care – PPO | Admitting: Family Medicine

## 2022-12-05 VITALS — BP 116/74 | HR 68 | Resp 18 | Ht 64.0 in | Wt 139.0 lb

## 2022-12-05 DIAGNOSIS — G8929 Other chronic pain: Secondary | ICD-10-CM

## 2022-12-05 DIAGNOSIS — I471 Supraventricular tachycardia, unspecified: Secondary | ICD-10-CM | POA: Diagnosis not present

## 2022-12-05 DIAGNOSIS — M8580 Other specified disorders of bone density and structure, unspecified site: Secondary | ICD-10-CM

## 2022-12-05 DIAGNOSIS — M25519 Pain in unspecified shoulder: Secondary | ICD-10-CM | POA: Insufficient documentation

## 2022-12-05 DIAGNOSIS — M25512 Pain in left shoulder: Secondary | ICD-10-CM | POA: Diagnosis not present

## 2022-12-05 NOTE — Assessment & Plan Note (Signed)
No issues, no concerns.  Compliant with medication.  Continue current dose.

## 2022-12-05 NOTE — Assessment & Plan Note (Signed)
Had DEXA scan performed earlier this year.  Is currently taking calcium and vitamin D supplementation.  Consider repeating DEXA in 2 years.  Encourage continued resistance training

## 2022-12-05 NOTE — Patient Instructions (Signed)
It was nice to see you today,  We addressed the following topics today: - continue taking your vitamin D and calcium - your cholesterol was great, we don't need to recheck it today.   Have a great day,  Frederic Jericho, MD

## 2022-12-05 NOTE — Progress Notes (Unsigned)
   Established Patient Office Visit  Subjective   Patient ID: Veronica Ray, female    DOB: 1956/03/29  Age: 67 y.o. MRN: [id number]  Chief Complaint  Patient presents with   Hyperlipidemia    fasting    HPI SVT-patient has no issues with tachycardia since taking the medication.  Tolerates it well.  No concerns.  Shoulder pain-patient went to the orthopedist and got an injection.  Ray that she has had no issues since then.  Ray she was told that she was just overworking it and since that time she has been more conservative with her exercise.  Osteopenia-patient taking calcium and vitamin D.  Also started doing weight lifting but only using light weights at this time.  Discussed proper lifting techniques and ways to avoid injury.  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  She teaches stage craft.  The 10-year ASCVD risk score (Arnett DK, et al., 2019) is: 5.2%  Health Maintenance Due  Topic Date Due   Hepatitis C Screening  Never done   Zoster Vaccines- Shingrix (1 of 2) Never done   Pneumonia Vaccine 90+ Years old (1 of 1 - PCV) Never done   COVID-19 Vaccine (8 - 2023-24 season) 05/31/2022   INFLUENZA VACCINE  11/20/2022      Objective:     BP 116/74 (BP Location: Left Arm, Patient Position: Sitting, Cuff Size: Normal)   Pulse 68   Resp 18   Ht 5\' 4"  (1.626 m)   Wt 139 lb (63 kg)   SpO2 99%   BMI 23.86 kg/m  {Vitals History (Optional):23777}  Physical Exam General: Alert, oriented Pulmonary: No respiratory distress Psych: Pleasant affect   No results found for any visits on 12/05/22.      Assessment & Plan:   Osteopenia, unspecified location Assessment & Plan: Had DEXA scan performed earlier this year.  Is currently taking calcium and vitamin D supplementation.  Consider repeating DEXA in 2 years.  Encourage continued resistance training   Paroxysmal SVT (supraventricular tachycardia) Assessment & Plan: No issues, no  concerns.  Compliant with medication.  Continue current dose.   Chronic left shoulder pain Assessment & Plan: Saw the orthopedist, got a local steroid injection.  Feels much better with this and rest.  Symptoms resolved.      Return in about 1 year (around 12/05/2023) for physical.    Sandre Kitty, MD

## 2022-12-05 NOTE — Assessment & Plan Note (Signed)
Saw the orthopedist, got a local steroid injection.  Feels much better with this and rest.  Symptoms resolved.

## 2023-02-26 ENCOUNTER — Encounter: Payer: Self-pay | Admitting: Family Medicine

## 2023-04-28 ENCOUNTER — Other Ambulatory Visit: Payer: Self-pay | Admitting: Family Medicine

## 2023-04-28 DIAGNOSIS — I471 Supraventricular tachycardia, unspecified: Secondary | ICD-10-CM

## 2023-05-28 ENCOUNTER — Encounter: Payer: Self-pay | Admitting: Family Medicine

## 2023-06-10 ENCOUNTER — Telehealth: Payer: Self-pay | Admitting: *Deleted

## 2023-06-10 DIAGNOSIS — J101 Influenza due to other identified influenza virus with other respiratory manifestations: Secondary | ICD-10-CM

## 2023-06-10 MED ORDER — OSELTAMIVIR PHOSPHATE 75 MG PO CAPS
75.0000 mg | ORAL_CAPSULE | Freq: Two times a day (BID) | ORAL | 0 refills | Status: AC
Start: 2023-06-10 — End: 2023-06-15

## 2023-06-10 NOTE — Telephone Encounter (Signed)
I will send Tamiflu which will not treat symptoms but will decrease the duration of symptoms.  She can take Tylenol for any headache, body aches, or fever and use over-the-counter cough medicine or decongestants if she develops any of those symptoms.  I recommend a nasal saline spray for the nasal irritation.

## 2023-06-10 NOTE — Telephone Encounter (Signed)
 Pt informed of below.

## 2023-06-10 NOTE — Telephone Encounter (Signed)
Copied from CRM [id number]. Topic: Clinical - Medication Question >> Jun 09, 2023  4:50 PM Antony Haste wrote: Reason for CRM: PT states he husband visited his Dr and was informed of having influenza A. Chatara is now having the same symptoms and would Tamiflu called in to [facility] in [place] - [address], [place] [address].

## 2023-06-10 NOTE — Telephone Encounter (Signed)
Contacted pt and she said that husband tested positive yesterday. She said that his symptoms started like Wednesday or Thursday of last week.  She said that she started with some nasal irritation yesterday and today she woke up with a headache.  She said her ears started acting a little funny 3-4 days ago not sure if that was a sign of things.  Please advise.

## 2023-09-19 ENCOUNTER — Other Ambulatory Visit: Payer: Self-pay | Admitting: Family Medicine

## 2023-09-19 DIAGNOSIS — I471 Supraventricular tachycardia, unspecified: Secondary | ICD-10-CM

## 2023-10-06 ENCOUNTER — Other Ambulatory Visit: Payer: Self-pay | Admitting: Physician Assistant

## 2023-10-06 DIAGNOSIS — Z1231 Encounter for screening mammogram for malignant neoplasm of breast: Secondary | ICD-10-CM

## 2023-10-08 ENCOUNTER — Ambulatory Visit
Admission: RE | Admit: 2023-10-08 | Discharge: 2023-10-08 | Disposition: A | Discharge status: Home / Self Care | Source: Ambulatory Visit | Attending: Physician Assistant | Admitting: Physician Assistant

## 2023-10-08 DIAGNOSIS — Z1231 Encounter for screening mammogram for malignant neoplasm of breast: Secondary | ICD-10-CM

## 2023-12-01 ENCOUNTER — Other Ambulatory Visit: Payer: BC Managed Care – PPO

## 2023-12-08 ENCOUNTER — Encounter: Payer: BC Managed Care – PPO | Admitting: Family Medicine
